# Patient Record
Sex: Female | Born: 1995 | Race: Black or African American | Hispanic: No | Marital: Single | Smoking: Former smoker
Health system: Southern US, Community
[De-identification: ages and names within clinical notes are randomized; demographics above are authoritative.]

## PROBLEM LIST (undated history)

## (undated) ENCOUNTER — Inpatient Hospital Stay (HOSPITAL_COMMUNITY): Payer: Self-pay

## (undated) HISTORY — PX: DILATION AND EVACUATION: SHX1459

---
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
## 2015-02-18 ENCOUNTER — Emergency Department (HOSPITAL_COMMUNITY)
Admission: EM | Admit: 2015-02-18 | Discharge: 2015-02-18 | Disposition: A | Discharge status: Home / Self Care | Payer: Managed Care, Other (non HMO) | Attending: Emergency Medicine | Admitting: Emergency Medicine

## 2015-02-18 ENCOUNTER — Encounter (HOSPITAL_COMMUNITY): Payer: Self-pay | Admitting: Emergency Medicine

## 2015-02-18 DIAGNOSIS — Z793 Long term (current) use of hormonal contraceptives: Secondary | ICD-10-CM | POA: Diagnosis not present

## 2015-02-18 DIAGNOSIS — N390 Urinary tract infection, site not specified: Secondary | ICD-10-CM

## 2015-02-18 DIAGNOSIS — Z3202 Encounter for pregnancy test, result negative: Secondary | ICD-10-CM | POA: Diagnosis not present

## 2015-02-18 DIAGNOSIS — R1031 Right lower quadrant pain: Secondary | ICD-10-CM | POA: Diagnosis present

## 2015-02-18 LAB — BASIC METABOLIC PANEL
Anion gap: 13 (ref 5–15)
BUN: 11 mg/dL (ref 6–20)
CHLORIDE: 104 mmol/L (ref 101–111)
CO2: 24 mmol/L (ref 22–32)
Calcium: 10.1 mg/dL (ref 8.9–10.3)
Creatinine, Ser: 0.95 mg/dL (ref 0.44–1.00)
GFR calc non Af Amer: >60 mL/min (ref >60)
Glucose, Bld: 83 mg/dL (ref 65–99)
POTASSIUM: 4 mmol/L (ref 3.5–5.1)
SODIUM: 141 mmol/L (ref 135–145)

## 2015-02-18 LAB — CBC WITH DIFFERENTIAL/PLATELET
Basophils Absolute: 0 10*3/uL (ref 0.0–0.1)
Basophils Relative: 0 % (ref 0–1)
EOS ABS: 0 10*3/uL (ref 0.0–0.7)
Eosinophils Relative: 0 % (ref 0–5)
HCT: 43.3 % (ref 36.0–46.0)
HEMOGLOBIN: 14.3 g/dL (ref 12.0–15.0)
Lymphocytes Relative: 15 % (ref 12–46)
Lymphs Abs: 1.4 10*3/uL (ref 0.7–4.0)
MCH: 30.2 pg (ref 26.0–34.0)
MCHC: 33 g/dL (ref 30.0–36.0)
MCV: 91.4 fL (ref 78.0–100.0)
MONO ABS: 0.3 10*3/uL (ref 0.1–1.0)
Monocytes Relative: 3 % (ref 3–12)
NEUTROS ABS: 8 10*3/uL — AB (ref 1.7–7.7)
NEUTROS PCT: 82 % — AB (ref 43–77)
Platelets: 247 10*3/uL (ref 150–400)
RBC: 4.74 MIL/uL (ref 3.87–5.11)
RDW: 12.3 % (ref 11.5–15.5)
WBC: 9.7 10*3/uL (ref 4.0–10.5)

## 2015-02-18 LAB — URINALYSIS, ROUTINE W REFLEX MICROSCOPIC
BILIRUBIN URINE: NEGATIVE
Glucose, UA: NEGATIVE mg/dL
KETONES UR: 15 mg/dL — AB
NITRITE: NEGATIVE
PROTEIN: 30 mg/dL — AB
Specific Gravity, Urine: 1.015 (ref 1.005–1.030)
UROBILINOGEN UA: 1 mg/dL (ref 0.0–1.0)
pH: 7.5 (ref 5.0–8.0)

## 2015-02-18 LAB — URINE MICROSCOPIC-ADD ON

## 2015-02-18 LAB — POC URINE PREG, ED: PREG TEST UR: NEGATIVE

## 2015-02-18 MED ORDER — HYDROCODONE-ACETAMINOPHEN 5-325 MG PO TABS
2.0000 | ORAL_TABLET | Freq: Once | ORAL | Status: AC
  Administered 2015-02-18: 2 via ORAL
  Filled 2015-02-18: qty 2

## 2015-02-18 MED ORDER — PHENAZOPYRIDINE HCL 200 MG PO TABS: 200.0000 mg | ORAL_TABLET | Freq: Three times a day (TID) | ORAL | Status: DC

## 2015-02-18 MED ORDER — IBUPROFEN 800 MG PO TABS: 800.0000 mg | ORAL_TABLET | Freq: Three times a day (TID) | ORAL | Status: DC

## 2015-02-18 MED ORDER — CEPHALEXIN 500 MG PO CAPS: 500.0000 mg | ORAL_CAPSULE | Freq: Four times a day (QID) | ORAL | Status: DC

## 2015-02-18 NOTE — ED Provider Notes (Signed)
CSN: [id number]     Arrival date & time 02/18/15  1722 History   First MD Initiated Contact with Patient 02/18/15 1737     Chief Complaint  Patient presents with  . Abdominal Pain     (Consider location/radiation/quality/duration/timing/severity/associated sxs/prior Treatment) HPI Comments: Patient presents to the ED with a chief complaint of RLQ pain.  Patient states that she has had dysuria x 1 week.  States that today she started having some pain that radiates to her side and to her back.  She reports some associated hematuria, chills, and nausea.  She has tried taking ibuprofen with no relief.  There are no aggravating or alleviating factors.  She denies vaginal discharge or bleeding.  The history is provided by the patient. No language interpreter was used.    History reviewed. No pertinent past medical history. History reviewed. No pertinent past surgical history. Family History  Problem Relation Age of Onset  . Hypertension Mother   . Hypertension Father    History  Substance Use Topics  . Smoking status: Never Smoker   . Smokeless tobacco: Not on file  . Alcohol Use: No   OB History    No data available     Review of Systems  Constitutional: Negative for fever and chills.  Respiratory: Negative for shortness of breath.   Cardiovascular: Negative for chest pain.  Gastrointestinal: Positive for abdominal pain. Negative for nausea, vomiting, diarrhea and constipation.  Genitourinary: Positive for dysuria and hematuria.  All other systems reviewed and are negative.     Allergies  Review of patient's allergies indicates no known allergies.  Home Medications   Prior to Admission medications   Medication Sig Start Date End Date Taking? Authorizing Provider  LO LOESTRIN FE 1 MG-10 MCG / 10 MCG tablet 1 tablet daily.  01/06/15  Yes Historical Provider, MD   BP 130/80 mmHg  Pulse 106  Temp(Src) 98.2 F (36.8 C) (Oral)  Resp 20  SpO2 100%  LMP 02/04/2015  (Approximate) Physical Exam  Constitutional: She is oriented to person, place, and time. She appears well-developed and well-nourished.  HENT:  Head: Normocephalic and atraumatic.  Eyes: Conjunctivae and EOM are normal. Pupils are equal, round, and reactive to light.  Neck: Normal range of motion. Neck supple.  Cardiovascular: Normal rate and regular rhythm.  Exam reveals no gallop and no friction rub.   No murmur heard. Pulmonary/Chest: Effort normal and breath sounds normal. No respiratory distress. She has no wheezes. She has no rales. She exhibits no tenderness.  Abdominal: Soft. Bowel sounds are normal. She exhibits no distension and no mass. There is tenderness. There is no rebound and no guarding.  Right lower quadrant is ttp, no rebound or guarding, no other focal abdominal tenderness  Musculoskeletal: Normal range of motion. She exhibits no edema or tenderness.  Neurological: She is alert and oriented to person, place, and time.  Skin: Skin is warm and dry.  Psychiatric: She has a normal mood and affect. Her behavior is normal. Judgment and thought content normal.  Nursing note and vitals reviewed.   ED Course  Procedures (including critical care time) Results for orders placed or performed during the hospital encounter of 02/18/15  Urinalysis, Routine w reflex microscopic  Result Value Ref Range   Color, Urine YELLOW YELLOW   APPearance CLOUDY (A) CLEAR   Specific Gravity, Urine 1.015 1.005 - 1.030   pH 7.5 5.0 - 8.0   Glucose, UA NEGATIVE NEGATIVE mg/dL   Hgb urine dipstick  SMALL (A) NEGATIVE   Bilirubin Urine NEGATIVE NEGATIVE   Ketones, ur 15 (A) NEGATIVE mg/dL   Protein, ur 30 (A) NEGATIVE mg/dL   Urobilinogen, UA 1.0 0.0 - 1.0 mg/dL   Nitrite NEGATIVE NEGATIVE   Leukocytes, UA SMALL (A) NEGATIVE  CBC with Differential/Platelet  Result Value Ref Range   WBC 9.7 4.0 - 10.5 K/uL   RBC 4.74 3.87 - 5.11 MIL/uL   Hemoglobin 14.3 12.0 - 15.0 g/dL   HCT 16.1 09.6 -  04.5 %   MCV 91.4 78.0 - 100.0 fL   MCH 30.2 26.0 - 34.0 pg   MCHC 33.0 30.0 - 36.0 g/dL   RDW 40.9 81.1 - 91.4 %   Platelets 247 150 - 400 K/uL   Neutrophils Relative % 82 (H) 43 - 77 %   Neutro Abs 8.0 (H) 1.7 - 7.7 K/uL   Lymphocytes Relative 15 12 - 46 %   Lymphs Abs 1.4 0.7 - 4.0 K/uL   Monocytes Relative 3 3 - 12 %   Monocytes Absolute 0.3 0.1 - 1.0 K/uL   Eosinophils Relative 0 0 - 5 %   Eosinophils Absolute 0.0 0.0 - 0.7 K/uL   Basophils Relative 0 0 - 1 %   Basophils Absolute 0.0 0.0 - 0.1 K/uL  Basic metabolic panel  Result Value Ref Range   Sodium 141 135 - 145 mmol/L   Potassium 4.0 3.5 - 5.1 mmol/L   Chloride 104 101 - 111 mmol/L   CO2 24 22 - 32 mmol/L   Glucose, Bld 83 65 - 99 mg/dL   BUN 11 6 - 20 mg/dL   Creatinine, Ser 7.82 0.44 - 1.00 mg/dL   Calcium 95.6 8.9 - 21.3 mg/dL   GFR calc non Af Amer >60 >60 mL/min   GFR calc Af Amer >60 >60 mL/min   Anion gap 13 5 - 15  Urine microscopic-add on  Result Value Ref Range   Squamous Epithelial / LPF RARE RARE   WBC, UA 21-50 <3 WBC/hpf   RBC / HPF 7-10 <3 RBC/hpf   Bacteria, UA FEW (A) RARE   Urine-Other MUCOUS PRESENT   POC urine preg, ED (not at [facility])  Result Value Ref Range   Preg Test, Ur NEGATIVE NEGATIVE   No results found.    EKG Interpretation None      MDM   Final diagnoses:  UTI (lower urinary tract infection)    Patient with RLQ abdominal pain and dysuria.  Suspect that as the patient has been having dysuria x 1 week that this is UTI vs early pyelo, but other etiologies for RLQ pain remain on the DDx.  Will check labs and UA and reassess.  Patient is otherwise non-toxic appearing and not in any apparent distress.  Labs are consistent with urinary tract infection. I believe this to be the most likely cause of the patient's symptoms. However, I discussed with the patient that I cannot rule out appendicitis or pain of ovarian etiology without advanced imaging. At this time, I do not feel this  is warranted, but have given the patient strict return precautions to which she agrees with.  Patient is stable and ready for discharge.    Roxy Horseman, PA-C 02/18/15 1909  Mancel Bale, MD 02/18/15 [phone]

## 2015-02-18 NOTE — Discharge Instructions (Signed)
Your emergency department workup today is complete. Findings are consistent with urinary tract infection. Should you have worsening symptoms, worsening pain, fever, or vomiting please return to the emergency department for repeat evaluation. Otherwise, please take your anti-biotics as prescribed.  Abdominal Pain Many things can cause abdominal pain. Usually, abdominal pain is not caused by a disease and will improve without treatment. It can often be observed and treated at home. Your health care provider will do a physical exam and possibly order blood tests and X-rays to help determine the seriousness of your pain. However, in many cases, more time must pass before a clear cause of the pain can be found. Before that point, your health care provider may not know if you need more testing or further treatment. HOME CARE INSTRUCTIONS  Monitor your abdominal pain for any changes. The following actions may help to alleviate any discomfort you are experiencing:  Only take over-the-counter or prescription medicines as directed by your health care provider.  Do not take laxatives unless directed to do so by your health care provider.  Try a clear liquid diet (broth, tea, or water) as directed by your health care provider. Slowly move to a bland diet as tolerated. SEEK MEDICAL CARE IF:  You have unexplained abdominal pain.  You have abdominal pain associated with nausea or diarrhea.  You have pain when you urinate or have a bowel movement.  You experience abdominal pain that wakes you in the night.  You have abdominal pain that is worsened or improved by eating food.  You have abdominal pain that is worsened with eating fatty foods.  You have a fever. SEEK IMMEDIATE MEDICAL CARE IF:   Your pain does not go away within 2 hours.  You keep throwing up (vomiting).  Your pain is felt only in portions of the abdomen, such as the right side or the left lower portion of the abdomen.  You pass  bloody or black tarry stools. MAKE SURE YOU:  Understand these instructions.   Will watch your condition.   Will get help right away if you are not doing well or get worse.  Document Released: 06/22/2005 Document Revised: 09/17/2013 Document Reviewed: 05/22/2013 [facility] Patient Information 2015 [place], [place]. This information is not intended to replace advice given to you by your health care provider. Make sure you discuss any questions you have with your health care provider. Urinary Tract Infection Urinary tract infections (UTIs) can develop anywhere along your urinary tract. Your urinary tract is your body's drainage system for removing wastes and extra water. Your urinary tract includes two kidneys, two ureters, a bladder, and a urethra. Your kidneys are a pair of bean-shaped organs. Each kidney is about the size of your fist. They are located below your ribs, one on each side of your spine. CAUSES Infections are caused by microbes, which are microscopic organisms, including fungi, viruses, and bacteria. These organisms are so small that they can only be seen through a microscope. Bacteria are the microbes that most commonly cause UTIs. SYMPTOMS  Symptoms of UTIs may vary by age and gender of the patient and by the location of the infection. Symptoms in young women typically include a frequent and intense urge to urinate and a painful, burning feeling in the bladder or urethra during urination. Older women and men are more likely to be tired, shaky, and weak and have muscle aches and abdominal pain. A fever may mean the infection is in your kidneys. Other symptoms of a  kidney infection include pain in your back or sides below the ribs, nausea, and vomiting. DIAGNOSIS To diagnose a UTI, your caregiver will ask you about your symptoms. Your caregiver also will ask to provide a urine sample. The urine sample will be tested for bacteria and white blood cells. White blood cells are made by your  body to help fight infection. TREATMENT  Typically, UTIs can be treated with medication. Because most UTIs are caused by a bacterial infection, they usually can be treated with the use of antibiotics. The choice of antibiotic and length of treatment depend on your symptoms and the type of bacteria causing your infection. HOME CARE INSTRUCTIONS  If you were prescribed antibiotics, take them exactly as your caregiver instructs you. Finish the medication even if you feel better after you have only taken some of the medication.  Drink enough water and fluids to keep your urine clear or pale yellow.  Avoid caffeine, tea, and carbonated beverages. They tend to irritate your bladder.  Empty your bladder often. Avoid holding urine for long periods of time.  Empty your bladder before and after sexual intercourse.  After a bowel movement, women should cleanse from front to back. Use each tissue only once. SEEK MEDICAL CARE IF:   You have back pain.  You develop a fever.  Your symptoms do not begin to resolve within 3 days. SEEK IMMEDIATE MEDICAL CARE IF:   You have severe back pain or lower abdominal pain.  You develop chills.  You have nausea or vomiting.  You have continued burning or discomfort with urination. MAKE SURE YOU:   Understand these instructions.  Will watch your condition.  Will get help right away if you are not doing well or get worse. Document Released: 06/22/2005 Document Revised: 03/13/2012 Document Reviewed: 10/21/2011 [facility] Patient Information 2015 [place]. This information is not intended to replace advice given to you by your health care provider. Make sure you discuss any questions you have with your health care provider.

## 2015-02-18 NOTE — ED Notes (Signed)
Pt reports that she began having pain when she urinates for the last week.  This morning when she woke up she had pain in her right lower abdomen which sometimes "shoot" to her back. She has also reports chills but no fever, general tiredness. Mom is bedside.

## 2015-02-18 NOTE — ED Notes (Signed)
Nurse currently drawing labs 

## 2015-02-21 ENCOUNTER — Encounter (HOSPITAL_COMMUNITY): Payer: Self-pay | Admitting: Emergency Medicine

## 2015-02-21 ENCOUNTER — Emergency Department (HOSPITAL_COMMUNITY): Payer: Managed Care, Other (non HMO)

## 2015-02-21 ENCOUNTER — Emergency Department (HOSPITAL_COMMUNITY)
Admission: EM | Admit: 2015-02-21 | Discharge: 2015-02-21 | Disposition: A | Discharge status: Home / Self Care | Payer: Managed Care, Other (non HMO) | Attending: Emergency Medicine | Admitting: Emergency Medicine

## 2015-02-21 DIAGNOSIS — R000 Tachycardia, unspecified: Secondary | ICD-10-CM | POA: Insufficient documentation

## 2015-02-21 DIAGNOSIS — N390 Urinary tract infection, site not specified: Secondary | ICD-10-CM | POA: Diagnosis present

## 2015-02-21 DIAGNOSIS — Z3202 Encounter for pregnancy test, result negative: Secondary | ICD-10-CM | POA: Insufficient documentation

## 2015-02-21 DIAGNOSIS — Z79899 Other long term (current) drug therapy: Secondary | ICD-10-CM | POA: Insufficient documentation

## 2015-02-21 DIAGNOSIS — N12 Tubulo-interstitial nephritis, not specified as acute or chronic: Secondary | ICD-10-CM | POA: Insufficient documentation

## 2015-02-21 LAB — URINE MICROSCOPIC-ADD ON

## 2015-02-21 LAB — URINALYSIS, ROUTINE W REFLEX MICROSCOPIC
Glucose, UA: NEGATIVE mg/dL
KETONES UR: NEGATIVE mg/dL
NITRITE: POSITIVE — AB
PROTEIN: 100 mg/dL — AB
SPECIFIC GRAVITY, URINE: 1.018 (ref 1.005–1.030)
UROBILINOGEN UA: 1 mg/dL (ref 0.0–1.0)
pH: 5.5 (ref 5.0–8.0)

## 2015-02-21 LAB — CBC WITH DIFFERENTIAL/PLATELET
BASOS ABS: 0 10*3/uL (ref 0.0–0.1)
BASOS PCT: 0 % (ref 0–1)
EOS ABS: 0.1 10*3/uL (ref 0.0–0.7)
EOS PCT: 1 % (ref 0–5)
HCT: 38.6 % (ref 36.0–46.0)
Hemoglobin: 12.7 g/dL (ref 12.0–15.0)
LYMPHS ABS: 1.6 10*3/uL (ref 0.7–4.0)
Lymphocytes Relative: 18 % (ref 12–46)
MCH: 30.1 pg (ref 26.0–34.0)
MCHC: 32.9 g/dL (ref 30.0–36.0)
MCV: 91.5 fL (ref 78.0–100.0)
Monocytes Absolute: 0.8 10*3/uL (ref 0.1–1.0)
Monocytes Relative: 9 % (ref 3–12)
NEUTROS ABS: 6.7 10*3/uL (ref 1.7–7.7)
NEUTROS PCT: 72 % (ref 43–77)
PLATELETS: 257 10*3/uL (ref 150–400)
RBC: 4.22 MIL/uL (ref 3.87–5.11)
RDW: 12.4 % (ref 11.5–15.5)
WBC: 9.1 10*3/uL (ref 4.0–10.5)

## 2015-02-21 LAB — COMPREHENSIVE METABOLIC PANEL
ALK PHOS: 63 U/L (ref 38–126)
ALT: 11 U/L — ABNORMAL LOW (ref 14–54)
AST: 15 U/L (ref 15–41)
Albumin: 4.3 g/dL (ref 3.5–5.0)
Anion gap: 10 (ref 5–15)
BILIRUBIN TOTAL: 0.6 mg/dL (ref 0.3–1.2)
BUN: 11 mg/dL (ref 6–20)
CO2: 24 mmol/L (ref 22–32)
CREATININE: 1.03 mg/dL — AB (ref 0.44–1.00)
Calcium: 9.3 mg/dL (ref 8.9–10.3)
Chloride: 104 mmol/L (ref 101–111)
GFR calc Af Amer: >60 mL/min (ref >60)
GFR calc non Af Amer: >60 mL/min (ref >60)
GLUCOSE: 101 mg/dL — AB (ref 65–99)
POTASSIUM: 3.7 mmol/L (ref 3.5–5.1)
SODIUM: 138 mmol/L (ref 135–145)
TOTAL PROTEIN: 8.3 g/dL — AB (ref 6.5–8.1)

## 2015-02-21 LAB — POC URINE PREG, ED: Preg Test, Ur: NEGATIVE

## 2015-02-21 LAB — I-STAT CG4 LACTIC ACID, ED: Lactic Acid, Venous: 2.06 mmol/L (ref 0.5–2.0)

## 2015-02-21 LAB — LIPASE, BLOOD: Lipase: 13 U/L — ABNORMAL LOW (ref 22–51)

## 2015-02-21 MED ORDER — CEFTRIAXONE SODIUM 1 G IJ SOLR
1.0000 g | Freq: Once | INTRAMUSCULAR | Status: AC
  Administered 2015-02-21: 1 g via INTRAVENOUS
  Filled 2015-02-21: qty 10

## 2015-02-21 MED ORDER — SODIUM CHLORIDE 0.9 % IV BOLUS (SEPSIS)
1000.0000 mL | Freq: Once | INTRAVENOUS | Status: AC
  Administered 2015-02-21: 1000 mL via INTRAVENOUS

## 2015-02-21 MED ORDER — HYDROCODONE-ACETAMINOPHEN 5-325 MG PO TABS: 1.0000 | ORAL_TABLET | Freq: Four times a day (QID) | ORAL | Status: DC | PRN

## 2015-02-21 MED ORDER — ACETAMINOPHEN 325 MG PO TABS
650.0000 mg | ORAL_TABLET | Freq: Once | ORAL | Status: AC
  Administered 2015-02-21: 650 mg via ORAL
  Filled 2015-02-21: qty 2

## 2015-02-21 MED ORDER — ONDANSETRON HCL 4 MG/2ML IJ SOLN
4.0000 mg | Freq: Once | INTRAMUSCULAR | Status: AC
  Administered 2015-02-21: 4 mg via INTRAVENOUS
  Filled 2015-02-21: qty 2

## 2015-02-21 MED ORDER — CIPROFLOXACIN HCL 500 MG PO TABS: 500.0000 mg | ORAL_TABLET | Freq: Two times a day (BID) | ORAL | Status: DC

## 2015-02-21 MED ORDER — MORPHINE SULFATE 4 MG/ML IJ SOLN
4.0000 mg | Freq: Once | INTRAMUSCULAR | Status: AC
  Administered 2015-02-21: 4 mg via INTRAVENOUS
  Filled 2015-02-21: qty 1

## 2015-02-21 NOTE — ED Notes (Signed)
US at bedside; will administer medications with procedure completion.

## 2015-02-21 NOTE — Discharge Instructions (Signed)
Pyelonephritis, Adult °Pyelonephritis is a kidney infection. In general, there are 2 main types of pyelonephritis: °· Infections that come on quickly without any warning (acute pyelonephritis). °· Infections that persist for a long period of time (chronic pyelonephritis). °CAUSES  °Two main causes of pyelonephritis are: °· Bacteria traveling from the bladder to the kidney. This is a problem especially in pregnant women. The urine in the bladder can become filled with bacteria from multiple causes, including: °¨ Inflammation of the prostate gland (prostatitis). °¨ Sexual intercourse in females. °¨ Bladder infection (cystitis). °· Bacteria traveling from the bloodstream to the tissue part of the kidney. °Problems that may increase your risk of getting a kidney infection include: °· Diabetes. °· Kidney stones or bladder stones. °· Cancer. °· Catheters placed in the bladder. °· Other abnormalities of the kidney or ureter. °SYMPTOMS  °· Abdominal pain. °· Pain in the side or flank area. °· Fever. °· Chills. °· Upset stomach. °· Blood in the urine (dark urine). °· Frequent urination. °· Strong or persistent urge to urinate. °· Burning or stinging when urinating. °DIAGNOSIS  °Your caregiver may diagnose your kidney infection based on your symptoms. A urine sample may also be taken. °TREATMENT  °In general, treatment depends on how severe the infection is.  °· If the infection is mild and caught early, your caregiver may treat you with oral antibiotics and send you home. °· If the infection is more severe, the bacteria may have gotten into the bloodstream. This will require intravenous (IV) antibiotics and a hospital stay. Symptoms may include: °¨ High fever. °¨ Severe flank pain. °¨ Shaking chills. °· Even after a hospital stay, your caregiver may require you to be on oral antibiotics for a period of time. °· Other treatments may be required depending upon the cause of the infection. °HOME CARE INSTRUCTIONS  °· Take your  antibiotics as directed. Finish them even if you start to feel better. °· Make an appointment to have your urine checked to make sure the infection is gone. °· Drink enough fluids to keep your urine clear or pale yellow. °· Take medicines for the bladder if you have urgency and frequency of urination as directed by your caregiver. °SEEK IMMEDIATE MEDICAL CARE IF:  °· You have a fever or persistent symptoms for more than 2-3 days. °· You have a fever and your symptoms suddenly get worse. °· You are unable to take your antibiotics or fluids. °· You develop shaking chills. °· You experience extreme weakness or fainting. °· There is no improvement after 2 days of treatment. °MAKE SURE YOU: °· Understand these instructions. °· Will watch your condition. °· Will get help right away if you are not doing well or get worse. °Document Released: 09/12/2005 Document Revised: 03/13/2012 Document Reviewed: 02/16/2011 °[employer]® Patient Information ©2015 [employer]. This information is not intended to replace advice given to you by your health care provider. Make sure you discuss any questions you have with your health care provider. ° °

## 2015-02-21 NOTE — ED Notes (Addendum)
Pt seen here on 5/25 dx with UTI, and told to return if pain increased. Pt states pain has increased to L flank while taking 800mg  Ibuprofen at home. Pt has also been taking Keflex and Pyridium, without symptom relief. Pt now c/o nausea, as well.

## 2015-02-21 NOTE — ED Provider Notes (Signed)
CSN: [id number]     Arrival date & time 02/21/15  0845 History   First MD Initiated Contact with Patient 02/21/15 [phone]     Chief Complaint  Patient presents with  . Urinary Tract Infection     (Consider location/radiation/quality/duration/timing/severity/associated sxs/prior Treatment) HPI  19 year old female presents with left-sided abdominal pain and flank pain. The patient was diagnosed with a UTI on 5/25 and given Keflex and Pyridium but states that her pain has increased. She's been having burning with urination for the last 2 weeks although that is now improving while on about antibiotics. Now is having nausea and vomited once a couple days ago. Denies diarrhea. Denies lower abdominal pain but more left flank pain and back pain. No cough or shortness of breath. Has not noticed any fevers. Patient has been taking ibuprofen at home without significant relief. Patient denies any vaginal bleeding or discharge. No right-sided abdominal pain.  History reviewed. No pertinent past medical history. History reviewed. No pertinent past surgical history. Family History  Problem Relation Age of Onset  . Hypertension Mother   . Hypertension Father    History  Substance Use Topics  . Smoking status: Never Smoker   . Smokeless tobacco: Not on file  . Alcohol Use: No   OB History    No data available     Review of Systems  Constitutional: Negative for fever.  Respiratory: Negative for cough and shortness of breath.   Gastrointestinal: Positive for nausea and abdominal pain. Negative for diarrhea.  Genitourinary: Positive for dysuria and flank pain. Negative for hematuria, vaginal bleeding and vaginal discharge.  Musculoskeletal: Positive for back pain.  All other systems reviewed and are negative.     Allergies  Review of patient's allergies indicates no known allergies.  Home Medications   Prior to Admission medications   Medication Sig Start Date End Date Taking? Authorizing  Provider  cephALEXin (KEFLEX) 500 MG capsule Take 1 capsule (500 mg total) by mouth 4 (four) times daily. 02/18/15   Roxy Horseman, PA-C  ibuprofen (ADVIL,MOTRIN) 800 MG tablet Take 1 tablet (800 mg total) by mouth 3 (three) times daily. 02/18/15   Roxy Horseman, PA-C  LO LOESTRIN FE 1 MG-10 MCG / 10 MCG tablet 1 tablet daily.  01/06/15   Historical Provider, MD  phenazopyridine (PYRIDIUM) 200 MG tablet Take 1 tablet (200 mg total) by mouth 3 (three) times daily. 02/18/15   Roxy Horseman, PA-C   BP 129/74 mmHg  Pulse 111  Temp(Src) 99.4 F (37.4 C) (Oral)  Resp 18  Ht  (1.6 m)  Wt 117 lb (53.071 kg)  BMI 20.73 kg/m2  SpO2 96%  LMP 02/04/2015 (Approximate) Physical Exam  Constitutional: She is oriented to person, place, and time. She appears well-developed and well-nourished.  HENT:  Head: Normocephalic and atraumatic.  Right Ear: External ear normal.  Left Ear: External ear normal.  Nose: Nose normal.  Eyes: Right eye exhibits no discharge. Left eye exhibits no discharge.  Cardiovascular: Regular rhythm and normal heart sounds.  Tachycardia present.   Pulmonary/Chest: Effort normal and breath sounds normal. She has no wheezes. She has no rales.  Abdominal: Soft. Normal appearance. There is tenderness (mild) in the right upper quadrant, epigastric area and left upper quadrant. There is CVA tenderness (L>R). There is no rigidity and no guarding.  No lower abdominal tenderness  Neurological: She is alert and oriented to person, place, and time.  Skin: Skin is warm and dry.  Nursing note and vitals reviewed.  ED Course  Procedures (including critical care time) Labs Review Labs Reviewed  COMPREHENSIVE METABOLIC PANEL - Abnormal; Notable for the following:    Glucose, Bld 101 (*)    Creatinine, Ser 1.03 (*)    Total Protein 8.3 (*)    ALT 11 (*)    All other components within normal limits  LIPASE, BLOOD - Abnormal; Notable for the following:    Lipase 13 (*)    All  other components within normal limits  URINALYSIS, ROUTINE W REFLEX MICROSCOPIC ([facility]) - Abnormal; Notable for the following:    Color, Urine ORANGE (*)    APPearance CLOUDY (*)    Hgb urine dipstick MODERATE (*)    Bilirubin Urine SMALL (*)    Protein, ur 100 (*)    Nitrite POSITIVE (*)    Leukocytes, UA SMALL (*)    All other components within normal limits  URINE MICROSCOPIC-ADD ON - Abnormal; Notable for the following:    Squamous Epithelial / LPF MANY (*)    Bacteria, UA MANY (*)    All other components within normal limits  I-STAT CG4 LACTIC ACID, ED - Abnormal; Notable for the following:    Lactic Acid, Venous 2.06 (*)    All other components within normal limits  URINE CULTURE  CBC WITH DIFFERENTIAL/PLATELET  POC URINE PREG, ED    Imaging Review [place] Abdomen Complete  02/21/2015   CLINICAL DATA:  Abdominal and left flank pain x3 days  EXAM: COMPLETE ABDOMINAL ULTRASOUND  COMPARISON:  None available  FINDINGS: Gallbladder: Physiologically distended without stones, wall thickening, or pericholecystic fluid. Sonographer reports no sonographic Murphy's sign.  Common bile duct:  Normal in caliber,  2-574mm diameter.  Liver: Homogeneous in echotexture without focal lesion or intrahepatic bile duct dilatation.  IVC:  Negative  Pancreas:  Negative  Spleen:  No focal lesion, craniocaudal 8.1cm in length.  Right Kidney:  No mass or hydronephrosis, 12.8cm in length.  Left Kidney: No lesion or hydronephrosis, 9.9 acute acutecm in length. Small echogenic focus in the lower pole collecting system.  Abdominal aorta:  Negative  IMPRESSION: 1. Normal gallbladder. 2. Possible left nephrolithiasis, without hydronephrosis.   Electronically Signed   By: Corlis Leak  Hassell M.D.   On: 02/21/2015 10:35     EKG Interpretation None      MDM   Final diagnoses:  Pyelonephritis    Patient's symptoms appear consistent with pyelonephritis. She has a low-grade fever with mild tachycardia. Pain has improved  and she appears well. She does have a mild lactic acid of 2, but has normal white blood cell count and normal renal function. Urinalysis is a dirty catch but is consistent with UTI. Ultrasound of her abdomen shows no obvious obstructing kidney stone or hydronephrosis. She has no lower abdominal symptoms which is a little odd but I believe she will need further treatment for pyelonephritis. I will change her and a box from Keflex to Cipro and she was given a dose of IV Rocephin here. She has a PCP, I have recommended they follow-up in 2 days or otherwise return here if symptoms are worsening.    Pricilla LovelessScott Aadhya Bustamante, MD 02/21/15 1055

## 2015-02-22 ENCOUNTER — Encounter (HOSPITAL_COMMUNITY): Payer: Self-pay

## 2015-02-22 ENCOUNTER — Emergency Department (HOSPITAL_COMMUNITY): Payer: Managed Care, Other (non HMO)

## 2015-02-22 ENCOUNTER — Emergency Department (HOSPITAL_COMMUNITY)
Admission: EM | Admit: 2015-02-22 | Discharge: 2015-02-22 | Disposition: A | Discharge status: Home / Self Care | Payer: Managed Care, Other (non HMO) | Attending: Emergency Medicine | Admitting: Emergency Medicine

## 2015-02-22 DIAGNOSIS — R110 Nausea: Secondary | ICD-10-CM | POA: Insufficient documentation

## 2015-02-22 DIAGNOSIS — Z79899 Other long term (current) drug therapy: Secondary | ICD-10-CM | POA: Insufficient documentation

## 2015-02-22 DIAGNOSIS — R300 Dysuria: Secondary | ICD-10-CM | POA: Insufficient documentation

## 2015-02-22 DIAGNOSIS — R109 Unspecified abdominal pain: Secondary | ICD-10-CM | POA: Diagnosis present

## 2015-02-22 DIAGNOSIS — I10 Essential (primary) hypertension: Secondary | ICD-10-CM | POA: Insufficient documentation

## 2015-02-22 DIAGNOSIS — IMO0001 Reserved for inherently not codable concepts without codable children: Secondary | ICD-10-CM

## 2015-02-22 LAB — CBC WITH DIFFERENTIAL/PLATELET
Basophils Absolute: 0 10*3/uL (ref 0.0–0.1)
Basophils Relative: 0 % (ref 0–1)
EOS ABS: 0.1 10*3/uL (ref 0.0–0.7)
Eosinophils Relative: 2 % (ref 0–5)
HCT: 35.3 % — ABNORMAL LOW (ref 36.0–46.0)
Hemoglobin: 11.4 g/dL — ABNORMAL LOW (ref 12.0–15.0)
Lymphocytes Relative: 33 % (ref 12–46)
Lymphs Abs: 2.1 10*3/uL (ref 0.7–4.0)
MCH: 29.7 pg (ref 26.0–34.0)
MCHC: 32.3 g/dL (ref 30.0–36.0)
MCV: 91.9 fL (ref 78.0–100.0)
MONO ABS: 0.4 10*3/uL (ref 0.1–1.0)
Monocytes Relative: 7 % (ref 3–12)
Neutro Abs: 3.6 10*3/uL (ref 1.7–7.7)
Neutrophils Relative %: 58 % (ref 43–77)
PLATELETS: 246 10*3/uL (ref 150–400)
RBC: 3.84 MIL/uL — AB (ref 3.87–5.11)
RDW: 12.2 % (ref 11.5–15.5)
WBC: 6.2 10*3/uL (ref 4.0–10.5)

## 2015-02-22 LAB — COMPREHENSIVE METABOLIC PANEL
ALK PHOS: 48 U/L (ref 38–126)
ALT: 8 U/L — ABNORMAL LOW (ref 14–54)
ANION GAP: 7 (ref 5–15)
AST: 10 U/L — ABNORMAL LOW (ref 15–41)
Albumin: 3.4 g/dL — ABNORMAL LOW (ref 3.5–5.0)
BUN: 8 mg/dL (ref 6–20)
CHLORIDE: 105 mmol/L (ref 101–111)
CO2: 27 mmol/L (ref 22–32)
Calcium: 9 mg/dL (ref 8.9–10.3)
Creatinine, Ser: 0.83 mg/dL (ref 0.44–1.00)
GLUCOSE: 89 mg/dL (ref 65–99)
POTASSIUM: 3.5 mmol/L (ref 3.5–5.1)
SODIUM: 139 mmol/L (ref 135–145)
TOTAL PROTEIN: 6.8 g/dL (ref 6.5–8.1)
Total Bilirubin: 0.4 mg/dL (ref 0.3–1.2)

## 2015-02-22 LAB — URINE MICROSCOPIC-ADD ON

## 2015-02-22 LAB — URINALYSIS, ROUTINE W REFLEX MICROSCOPIC
Bilirubin Urine: NEGATIVE
Glucose, UA: NEGATIVE mg/dL
Ketones, ur: NEGATIVE mg/dL
NITRITE: NEGATIVE
PROTEIN: 30 mg/dL — AB
Specific Gravity, Urine: 1.014 (ref 1.005–1.030)
UROBILINOGEN UA: 1 mg/dL (ref 0.0–1.0)
pH: 6 (ref 5.0–8.0)

## 2015-02-22 LAB — URINE CULTURE
COLONY COUNT: NO GROWTH
CULTURE: NO GROWTH

## 2015-02-22 LAB — LIPASE, BLOOD: Lipase: 13 U/L — ABNORMAL LOW (ref 22–51)

## 2015-02-22 MED ORDER — DEXTROSE 5 % IV SOLN
1.0000 g | Freq: Once | INTRAVENOUS | Status: AC
  Administered 2015-02-22: 1 g via INTRAVENOUS
  Filled 2015-02-22: qty 10

## 2015-02-22 MED ORDER — KETOROLAC TROMETHAMINE 30 MG/ML IJ SOLN
30.0000 mg | Freq: Once | INTRAMUSCULAR | Status: AC
  Administered 2015-02-22: 30 mg via INTRAVENOUS
  Filled 2015-02-22: qty 1

## 2015-02-22 MED ORDER — SODIUM CHLORIDE 0.9 % IV BOLUS (SEPSIS)
1000.0000 mL | Freq: Once | INTRAVENOUS | Status: AC
  Administered 2015-02-22: 1000 mL via INTRAVENOUS

## 2015-02-22 MED ORDER — ONDANSETRON HCL 4 MG/2ML IJ SOLN
4.0000 mg | Freq: Once | INTRAMUSCULAR | Status: AC
  Administered 2015-02-22: 4 mg via INTRAVENOUS
  Filled 2015-02-22: qty 2

## 2015-02-22 NOTE — ED Provider Notes (Signed)
Patient signed out to me by Jaynie Crumbleatyana Kirichenko PA-C at shift change pending urinalysis results. Please see her note for full H&P. Patient here for flank pain, suspected nephritis, has antibiotics, pain medication, antinausea medication at home. Urinalysis unchanged from yesterday with the addition of a few more red blood cells. Repeat abdominal exam showed minor tenderness to the left quadrant minimal CVA tenderness. Patient was nontoxic appearing, in no acute distress, reports improved symptoms. She discharged home with instructions to use prescribed medications as directed, follow-up in 2-3 days with primary care provider for further evaluation and management of symptoms continue to persist. She is instructed to return to emergency room immediately if new or worsening signs or symptoms present. Patient verbalized understanding and agreement to the plan and had no further questions or concerns at time of discharge.  Eyvonne MechanicJeffrey Mikki Ziff, PA-C 02/22/15 1742  Doug SouSam Jacubowitz, MD 02/23/15 0001

## 2015-02-22 NOTE — ED Notes (Signed)
Patient c/o left flank pain and states has had x 3 weeks. Patient was seen in the ED for the same on 02/18/15 and 02/21/15 for a kidney infection and kidney stone. Patient states the pain has gotten progressively worse. Patient also c/o nausea.

## 2015-02-22 NOTE — ED Notes (Signed)
Pt. Asked to give urine specimen, can't void at present moment. Nurse Aware

## 2015-02-22 NOTE — ED Notes (Signed)
Patient was educated not to drive, operate heavy machinery, or drink alcohol while taking narcotic medication.  

## 2015-02-22 NOTE — Discharge Instructions (Signed)
Continue cipro until all gone. Take ibuprofen for pain in addition to Norco for pain.  It is not unusual to have pain for several days with kidney infection.  You can take Noorco 1-2 tabs every 4-6 hrs as needed. If symptoms not improving return to ER or follow up. Today CT and labs look good.   Pyelonephritis, Adult Pyelonephritis is a kidney infection. In general, there are 2 main types of pyelonephritis:  Infections that come on quickly without any warning (acute pyelonephritis).  Infections that persist for a long period of time (chronic pyelonephritis). CAUSES  Two main causes of pyelonephritis are:  Bacteria traveling from the bladder to the kidney. This is a problem especially in pregnant women. The urine in the bladder can become filled with bacteria from multiple causes, including:  Inflammation of the prostate gland (prostatitis).  Sexual intercourse in females.  Bladder infection (cystitis).  Bacteria traveling from the bloodstream to the tissue part of the kidney. Problems that may increase your risk of getting a kidney infection include:  Diabetes.  Kidney stones or bladder stones.  Cancer.  Catheters placed in the bladder.  Other abnormalities of the kidney or ureter. SYMPTOMS   Abdominal pain.  Pain in the side or flank area.  Fever.  Chills.  Upset stomach.  Blood in the urine (dark urine).  Frequent urination.  Strong or persistent urge to urinate.  Burning or stinging when urinating. DIAGNOSIS  Your caregiver may diagnose your kidney infection based on your symptoms. A urine sample may also be taken. TREATMENT  In general, treatment depends on how severe the infection is.   If the infection is mild and caught early, your caregiver may treat you with oral antibiotics and send you home.  If the infection is more severe, the bacteria may have gotten into the bloodstream. This will require intravenous (IV) antibiotics and a hospital stay.  Symptoms may include:  High fever.  Severe flank pain.  Shaking chills.  Even after a hospital stay, your caregiver may require you to be on oral antibiotics for a period of time.  Other treatments may be required depending upon the cause of the infection. HOME CARE INSTRUCTIONS   Take your antibiotics as directed. Finish them even if you start to feel better.  Make an appointment to have your urine checked to make sure the infection is gone.  Drink enough fluids to keep your urine clear or pale yellow.  Take medicines for the bladder if you have urgency and frequency of urination as directed by your caregiver. SEEK IMMEDIATE MEDICAL CARE IF:   You have a fever or persistent symptoms for more than 2-3 days.  You have a fever and your symptoms suddenly get worse.  You are unable to take your antibiotics or fluids.  You develop shaking chills.  You experience extreme weakness or fainting.  There is no improvement after 2 days of treatment. MAKE SURE YOU:  Understand these instructions.  Will watch your condition.  Will get help right away if you are not doing well or get worse. Document Released: 09/12/2005 Document Revised: 03/13/2012 Document Reviewed: 02/16/2011 [facility] Patient Information 2015 [employer], [place]. This information is not intended to replace advice given to you by your health care provider. Make sure you discuss any questions you have with your health care provider.

## 2015-02-22 NOTE — ED Provider Notes (Signed)
CSN: [id number]     Arrival date & time 02/22/15  1239 History   First MD Initiated Contact with Patient 02/22/15 1304     Chief Complaint  Patient presents with  . Flank Pain  . Nausea     (Consider location/radiation/quality/duration/timing/severity/associated sxs/prior Treatment) HPI Melissa Lattie CornsMorse is a 19 y.o. female with history of hypertension, presents to emergency department complaining of nausea, left flank pain, urinary symptoms. Patient states her symptoms started with dysuria 4 days ago. She was seen in emergency department. At that time she was started on Keflex, Pyridium. She states that her symptoms did not get better and she was seen again yesterday. At that time she developed left flank pain and nausea. She was evaluated with labs, urine analysis which again showed possible UTI, she had ultrasound abdomen which showed possible left renal stone with no hydronephrosis. She was diagnosed up all nephritis, she received 1 g of Rocephin and discharged home now on Cipro. Patient states since yesterday pain got worse. She denies any vomiting. She denies any diarrhea. She still having some dysuria. She denies any fever or chills.  History reviewed. No pertinent past medical history. History reviewed. No pertinent past surgical history. Family History  Problem Relation Age of Onset  . Hypertension Mother   . Hypertension Father    History  Substance Use Topics  . Smoking status: Never Smoker   . Smokeless tobacco: Never Used  . Alcohol Use: No   OB History    No data available     Review of Systems  Constitutional: Negative for fever and chills.  Respiratory: Negative for cough, chest tightness and shortness of breath.   Cardiovascular: Negative for chest pain, palpitations and leg swelling.  Gastrointestinal: Positive for nausea and abdominal pain. Negative for vomiting and diarrhea.  Genitourinary: Positive for dysuria and flank pain. Negative for vaginal bleeding, vaginal  discharge, vaginal pain and pelvic pain.  Musculoskeletal: Negative for myalgias, arthralgias, neck pain and neck stiffness.  Skin: Negative for rash.  Neurological: Negative for dizziness, weakness and headaches.  All other systems reviewed and are negative.     Allergies  Review of patient's allergies indicates no known allergies.  Home Medications   Prior to Admission medications   Medication Sig Start Date End Date Taking? Authorizing Provider  ciprofloxacin (CIPRO) 500 MG tablet Take 1 tablet (500 mg total) by mouth 2 (two) times daily. One po bid x 7 days 02/21/15  Yes Pricilla LovelessScott Goldston, MD  HYDROcodone-acetaminophen (NORCO) 5-325 MG per tablet Take 1 tablet by mouth every 6 (six) hours as needed for severe pain. 02/21/15  Yes Pricilla LovelessScott Goldston, MD  LO LOESTRIN FE 1 MG-10 MCG / 10 MCG tablet 1 tablet daily.  01/06/15  Yes Historical Provider, MD  ibuprofen (ADVIL,MOTRIN) 800 MG tablet Take 1 tablet (800 mg total) by mouth 3 (three) times daily. Patient not taking: Reported on 02/22/2015 02/18/15   Roxy Horsemanobert Browning, PA-C  phenazopyridine (PYRIDIUM) 200 MG tablet Take 1 tablet (200 mg total) by mouth 3 (three) times daily. Patient not taking: Reported on 02/22/2015 02/18/15   Roxy Horsemanobert Browning, PA-C   BP 128/82 mmHg  Pulse 88  Temp(Src) 98.4 F (36.9 C) (Oral)  Resp 18  SpO2 100%  LMP 02/04/2015 (Approximate) Physical Exam  Constitutional: She is oriented to person, place, and time. She appears well-developed and well-nourished. No distress.  HENT:  Head: Normocephalic.  Eyes: Conjunctivae are normal.  Neck: Neck supple.  Cardiovascular: Normal rate, regular rhythm and normal heart  sounds.   Pulmonary/Chest: Effort normal and breath sounds normal. No respiratory distress. She has no wheezes. She has no rales.  Abdominal: Soft. Bowel sounds are normal. She exhibits no distension. There is no tenderness. There is no rebound.  Left CVA tenderness  Musculoskeletal: She exhibits no edema.   Neurological: She is alert and oriented to person, place, and time.  Skin: Skin is warm and dry.  Psychiatric: She has a normal mood and affect. Her behavior is normal.  Nursing note and vitals reviewed.   ED Course  Procedures (including critical care time) Labs Review Labs Reviewed  CBC WITH DIFFERENTIAL/PLATELET - Abnormal; Notable for the following:    RBC 3.84 (*)    Hemoglobin 11.4 (*)    HCT 35.3 (*)    All other components within normal limits  COMPREHENSIVE METABOLIC PANEL - Abnormal; Notable for the following:    Albumin 3.4 (*)    AST 10 (*)    ALT 8 (*)    All other components within normal limits  LIPASE, BLOOD - Abnormal; Notable for the following:    Lipase 13 (*)    All other components within normal limits  URINALYSIS, ROUTINE W REFLEX MICROSCOPIC (NOT AT [facility]) - Abnormal; Notable for the following:    Color, Urine AMBER (*)    APPearance CLOUDY (*)    Hgb urine dipstick LARGE (*)    Protein, ur 30 (*)    Leukocytes, UA SMALL (*)    All other components within normal limits  URINE MICROSCOPIC-ADD ON - Abnormal; Notable for the following:    Squamous Epithelial / LPF MANY (*)    Bacteria, UA FEW (*)    All other components within normal limits    Imaging Review US Abdomen Complete  02/21/2015   CLINICAL DATA:  Abdominal and left flank pain x3 days  EXAM: COMPLETE ABDOMINAL ULTRASOUND  COMPARISON:  None available  FINDINGS: Gallbladder: Physiologically distended without stones, wall thickening, or pericholecystic fluid. Sonographer reports no sonographic Murphy's sign.  Common bile duct:  Normal in caliber,  2-32mm diameter.  Liver: Homogeneous in echotexture without focal lesion or intrahepatic bile duct dilatation.  IVC:  Negative  Pancreas:  Negative  Spleen:  No focal lesion, craniocaudal 8.1cm in length.  Right Kidney:  No mass or hydronephrosis, 12.8cm in length.  Left Kidney: No lesion or hydronephrosis, 9.9 acute acutecm in length. Small echogenic focus  in the lower pole collecting system.  Abdominal aorta:  Negative  IMPRESSION: 1. Normal gallbladder. 2. Possible left nephrolithiasis, without hydronephrosis.   Electronically Signed   By: Corlis Leak M.D.   On: 02/21/2015 10:35     EKG Interpretation None      MDM   Final diagnoses:  Lt flank pain    patient diagnosed with all nephritis yesterday, symptoms now for 5 days, worsening pain. She denies any fever, no vomiting. She is currently on Cipro, just started on yesterday prior to then was on Keflex. Her abdomen is benign. Left CVA tenderness present. Differential includes worsening pyelonephritis versus possible kidney stone. Will get labs, CT abdomen and pelvis for evaluation. Will repeat urinalysis. Culture from yesterday has no growth.  3:15 PM Pt's pain improved with toradol. Pending CT  4:00 PM CT negative. Pt still feeling much better. I question possibility of her passing a stone while in ED, given a stone was seen on yesterdays [place]. UA pending. Pt is afebrile, no elevation in WBC count or neutrophils, no vomiting, I doubt severe  pyelonephritis. No other findings on CT to explain left flank pain however. Plan to d/c home, continue antibitoics. She received another dose of rocephin 1g IV while in ED. Instructed to increase norco frequency if needed. Plan to d/c home once UA is back. Signed out to PA Hedges pending UA   Jaynie Crumble, PA-C 02/23/15 1478  Mancel Bale, MD 02/23/15 1622

## 2015-08-28 ENCOUNTER — Emergency Department (INDEPENDENT_AMBULATORY_CARE_PROVIDER_SITE_OTHER)
Admission: EM | Admit: 2015-08-28 | Discharge: 2015-08-28 | Disposition: A | Discharge status: Home / Self Care | Payer: 59 | Source: Home / Self Care | Attending: Family Medicine | Admitting: Family Medicine

## 2015-08-28 ENCOUNTER — Encounter (HOSPITAL_COMMUNITY): Payer: Self-pay | Admitting: *Deleted

## 2015-08-28 ENCOUNTER — Other Ambulatory Visit (HOSPITAL_COMMUNITY)
Admission: RE | Admit: 2015-08-28 | Discharge: 2015-08-28 | Disposition: A | Discharge status: Home / Self Care | Payer: 59 | Source: Ambulatory Visit | Attending: Family Medicine | Admitting: Family Medicine

## 2015-08-28 DIAGNOSIS — N390 Urinary tract infection, site not specified: Secondary | ICD-10-CM | POA: Insufficient documentation

## 2015-08-28 LAB — POCT URINALYSIS DIP (DEVICE)
GLUCOSE, UA: NEGATIVE mg/dL
HGB URINE DIPSTICK: NEGATIVE
KETONES UR: 40 mg/dL — AB
Nitrite: NEGATIVE
Protein, ur: 30 mg/dL — AB
Urobilinogen, UA: 1 mg/dL (ref 0.0–1.0)
pH: 6 (ref 5.0–8.0)

## 2015-08-28 LAB — POCT PREGNANCY, URINE: Preg Test, Ur: NEGATIVE

## 2015-08-28 MED ORDER — CIPROFLOXACIN HCL 500 MG PO TABS: 500.0000 mg | ORAL_TABLET | Freq: Two times a day (BID) | ORAL | Status: DC

## 2015-08-28 NOTE — ED Provider Notes (Signed)
CSN: [id number]     Arrival date & time 08/28/15  1556 History   First MD Initiated Contact with Patient 08/28/15 1646     Chief Complaint  Patient presents with  . Urinary Tract Infection   (Consider location/radiation/quality/duration/timing/severity/associated sxs/prior Treatment) Patient is a 19 y.o. female presenting with dysuria. The history is provided by the patient and a parent.  Dysuria Pain quality:  Burning Pain severity:  Mild Onset quality:  Gradual Duration:  2 days Chronicity:  New Recent urinary tract infections: yes   Relieved by:  None tried Ineffective treatments:  None tried Urinary symptoms: frequent urination   Associated symptoms: no fever, no flank pain, no nausea and no vomiting     History reviewed. No pertinent past medical history. History reviewed. No pertinent past surgical history. Family History  Problem Relation Age of Onset  . Hypertension Mother   . Hypertension Father    Social History  Substance Use Topics  . Smoking status: Never Smoker   . Smokeless tobacco: Never Used  . Alcohol Use: No   OB History    No data available     Review of Systems  Constitutional: Negative for fever.  Gastrointestinal: Negative for nausea and vomiting.  Genitourinary: Positive for dysuria, urgency and frequency. Negative for flank pain.  All other systems reviewed and are negative.   Allergies  Review of patient's allergies indicates no known allergies.  Home Medications   Prior to Admission medications   Medication Sig Start Date End Date Taking? Authorizing Provider  ciprofloxacin (CIPRO) 500 MG tablet Take 1 tablet (500 mg total) by mouth 2 (two) times daily. 08/28/15   Linna HoffJames D Alfie Rideaux, MD  HYDROcodone-acetaminophen (NORCO) 5-325 MG per tablet Take 1 tablet by mouth every 6 (six) hours as needed for severe pain. 02/21/15   Pricilla LovelessScott Goldston, MD  ibuprofen (ADVIL,MOTRIN) 800 MG tablet Take 1 tablet (800 mg total) by mouth 3 (three) times  daily. Patient not taking: Reported on 02/22/2015 02/18/15   Roxy Horsemanobert Browning, PA-C  LO LOESTRIN FE 1 MG-10 MCG / 10 MCG tablet 1 tablet daily.  01/06/15   Historical Provider, MD  phenazopyridine (PYRIDIUM) 200 MG tablet Take 1 tablet (200 mg total) by mouth 3 (three) times daily. Patient not taking: Reported on 02/22/2015 02/18/15   Roxy Horsemanobert Browning, PA-C   Meds Ordered and Administered this Visit  Medications - No data to display  BP 147/91 mmHg  Pulse 83  Temp(Src) 97.7 F (36.5 C) (Oral)  Resp 16  SpO2 97%  LMP  (Within Months) No data found.   Physical Exam  Constitutional: She is oriented to person, place, and time. She appears well-developed and well-nourished. No distress.  Abdominal: Soft. Bowel sounds are normal. She exhibits no mass. There is no tenderness. There is no rebound and no guarding.  Neurological: She is alert and oriented to person, place, and time.  Skin: Skin is warm and dry.  Nursing note and vitals reviewed.   ED Course  Procedures (including critical care time)  Labs Review Labs Reviewed  POCT URINALYSIS DIP (DEVICE) - Abnormal; Notable for the following:    Bilirubin Urine SMALL (*)    Ketones, ur 40 (*)    Protein, ur 30 (*)    Leukocytes, UA TRACE (*)    All other components within normal limits  URINE CULTURE  POCT PREGNANCY, URINE    Imaging Review No results found.   Visual Acuity Review  Right Eye Distance:   Left Eye Distance:  Bilateral Distance:    Right Eye Near:   Left Eye Near:    Bilateral Near:         MDM   1. UTI (lower urinary tract infection)       Linna Hoff, MD 08/29/15 2015

## 2015-08-28 NOTE — Discharge Instructions (Signed)
Take all of medicine as directed, drink lots of fluids, see your doctor if further problems. °

## 2015-08-28 NOTE — ED Notes (Signed)
Pt  Reports     Symptoms  Of  Urinary    Tract       Infection         Symptoms  Started  Yesterday     Frequency  And  Burning  On  Urination

## 2015-08-30 LAB — URINE CULTURE
Culture: >=100000
SPECIAL REQUESTS: NORMAL

## 2015-12-06 ENCOUNTER — Emergency Department (HOSPITAL_COMMUNITY)
Admission: EM | Admit: 2015-12-06 | Discharge: 2015-12-07 | Disposition: A | Discharge status: Home / Self Care | Payer: 59 | Attending: Emergency Medicine | Admitting: Emergency Medicine

## 2015-12-06 ENCOUNTER — Emergency Department (HOSPITAL_COMMUNITY): Payer: 59

## 2015-12-06 ENCOUNTER — Encounter (HOSPITAL_COMMUNITY): Payer: Self-pay | Admitting: Emergency Medicine

## 2015-12-06 DIAGNOSIS — Z79818 Long term (current) use of other agents affecting estrogen receptors and estrogen levels: Secondary | ICD-10-CM | POA: Insufficient documentation

## 2015-12-06 DIAGNOSIS — R079 Chest pain, unspecified: Secondary | ICD-10-CM | POA: Insufficient documentation

## 2015-12-06 DIAGNOSIS — R0602 Shortness of breath: Secondary | ICD-10-CM | POA: Diagnosis not present

## 2015-12-06 DIAGNOSIS — R42 Dizziness and giddiness: Secondary | ICD-10-CM | POA: Insufficient documentation

## 2015-12-06 DIAGNOSIS — M545 Low back pain: Secondary | ICD-10-CM | POA: Insufficient documentation

## 2015-12-06 DIAGNOSIS — Z792 Long term (current) use of antibiotics: Secondary | ICD-10-CM | POA: Insufficient documentation

## 2015-12-06 DIAGNOSIS — Z3202 Encounter for pregnancy test, result negative: Secondary | ICD-10-CM | POA: Insufficient documentation

## 2015-12-06 LAB — I-STAT BETA HCG BLOOD, ED (MC, WL, AP ONLY): I-stat hCG, quantitative: <5 m[IU]/mL (ref <5)

## 2015-12-06 LAB — I-STAT TROPONIN, ED: Troponin i, poc: 0 ng/mL (ref 0.00–0.08)

## 2015-12-06 NOTE — ED Notes (Signed)
Patient c/o mid lower chest pain. States it started suddenly while watching TV. Patient reports this is not the first time shes had this pain. Patient describes pain as sharp and pressure that is radiating to her back.

## 2015-12-07 LAB — CBC
HEMATOCRIT: 41.9 % (ref 36.0–46.0)
Hemoglobin: 14.1 g/dL (ref 12.0–15.0)
MCH: 30.6 pg (ref 26.0–34.0)
MCHC: 33.7 g/dL (ref 30.0–36.0)
MCV: 90.9 fL (ref 78.0–100.0)
Platelets: 252 10*3/uL (ref 150–400)
RBC: 4.61 MIL/uL (ref 3.87–5.11)
RDW: 13 % (ref 11.5–15.5)
WBC: 6.2 10*3/uL (ref 4.0–10.5)

## 2015-12-07 LAB — BASIC METABOLIC PANEL
Anion gap: 9 (ref 5–15)
BUN: 20 mg/dL (ref 6–20)
CHLORIDE: 106 mmol/L (ref 101–111)
CO2: 28 mmol/L (ref 22–32)
Calcium: 10.4 mg/dL — ABNORMAL HIGH (ref 8.9–10.3)
Creatinine, Ser: 0.95 mg/dL (ref 0.44–1.00)
Glucose, Bld: 70 mg/dL (ref 65–99)
POTASSIUM: 3.9 mmol/L (ref 3.5–5.1)
SODIUM: 143 mmol/L (ref 135–145)

## 2015-12-07 NOTE — Discharge Instructions (Signed)
Nonspecific Chest Pain  °Chest pain can be caused by many different conditions. There is always a chance that your pain could be related to something serious, such as a heart attack or a blood clot in your lungs. Chest pain can also be caused by conditions that are not life-threatening. If you have chest pain, it is very important to follow up with your health care provider. °CAUSES  °Chest pain can be caused by: °· Heartburn. °· Pneumonia or bronchitis. °· Anxiety or stress. °· Inflammation around your heart (pericarditis) or lung (pleuritis or pleurisy). °· A blood clot in your lung. °· A collapsed lung (pneumothorax). It can develop suddenly on its own (spontaneous pneumothorax) or from trauma to the chest. °· Shingles infection (varicella-zoster virus). °· Heart attack. °· Damage to the bones, muscles, and cartilage that make up your chest wall. This can include: °· Bruised bones due to injury. °· Strained muscles or cartilage due to frequent or repeated coughing or overwork. °· Fracture to one or more ribs. °· Sore cartilage due to inflammation (costochondritis). °RISK FACTORS  °Risk factors for chest pain may include: °· Activities that increase your risk for trauma or injury to your chest. °· Respiratory infections or conditions that cause frequent coughing. °· Medical conditions or overeating that can cause heartburn. °· Heart disease or family history of heart disease. °· Conditions or health behaviors that increase your risk of developing a blood clot. °· Having had chicken pox (varicella zoster). °SIGNS AND SYMPTOMS °Chest pain can feel like: °· Burning or tingling on the surface of your chest or deep in your chest. °· Crushing, pressure, aching, or squeezing pain. °· Dull or sharp pain that is worse when you move, cough, or take a deep breath. °· Pain that is also felt in your back, neck, shoulder, or arm, or pain that spreads to any of these areas. °Your chest pain may come and go, or it may stay  constant. °DIAGNOSIS °Lab tests or other studies may be needed to find the cause of your pain. Your health care provider may have you take a test called an ambulatory ECG (electrocardiogram). An ECG records your heartbeat patterns at the time the test is performed. You may also have other tests, such as: °· Transthoracic echocardiogram (TTE). During echocardiography, sound waves are used to create a picture of all of the heart structures and to look at how blood flows through your heart. °· Transesophageal echocardiogram (TEE). This is a more advanced imaging test that obtains images from inside your body. It allows your health care provider to see your heart in finer detail. °· Cardiac monitoring. This allows your health care provider to monitor your heart rate and rhythm in real time. °· Holter monitor. This is a portable device that records your heartbeat and can help to diagnose abnormal heartbeats. It allows your health care provider to track your heart activity for several days, if needed. °· Stress tests. These can be done through exercise or by taking medicine that makes your heart beat more quickly. °· Blood tests. °· Imaging tests. °TREATMENT  °Your treatment depends on what is causing your chest pain. Treatment may include: °· Medicines. These may include: °· Acid blockers for heartburn. °· Anti-inflammatory medicine. °· Pain medicine for inflammatory conditions. °· Antibiotic medicine, if an infection is present. °· Medicines to dissolve blood clots. °· Medicines to treat coronary artery disease. °· Supportive care for conditions that do not require medicines. This may include: °· Resting. °· Applying heat   or cold packs to injured areas. °· Limiting activities until pain decreases. °HOME CARE INSTRUCTIONS °· If you were prescribed an antibiotic medicine, finish it all even if you start to feel better. °· Avoid any activities that bring on chest pain. °· Do not use any tobacco products, including  cigarettes, chewing tobacco, or electronic cigarettes. If you need help quitting, ask your health care provider. °· Do not drink alcohol. °· Take medicines only as directed by your health care provider. °· Keep all follow-up visits as directed by your health care provider. This is important. This includes any further testing if your chest pain does not go away. °· If heartburn is the cause for your chest pain, you may be told to keep your head raised (elevated) while sleeping. This reduces the chance that acid will go from your stomach into your esophagus. °· Make lifestyle changes as directed by your health care provider. These may include: °¨ Getting regular exercise. Ask your health care provider to suggest some activities that are safe for you. °¨ Eating a heart-healthy diet. A registered dietitian can help you to learn healthy eating options. °¨ Maintaining a healthy weight. °¨ Managing diabetes, if necessary. °¨ Reducing stress. °SEEK MEDICAL CARE IF: °· Your chest pain does not go away after treatment. °· You have a rash with blisters on your chest. °· You have a fever. °SEEK IMMEDIATE MEDICAL CARE IF:  °· Your chest pain is worse. °· You have an increasing cough, or you cough up blood. °· You have severe abdominal pain. °· You have severe weakness. °· You faint. °· You have chills. °· You have sudden, unexplained chest discomfort. °· You have sudden, unexplained discomfort in your arms, back, neck, or jaw. °· You have shortness of breath at any time. °· You suddenly start to sweat, or your skin gets clammy. °· You feel nauseous or you vomit. °· You suddenly feel light-headed or dizzy. °· Your heart begins to beat quickly, or it feels like it is skipping beats. °These symptoms may represent a serious problem that is an emergency. Do not wait to see if the symptoms will go away. Get medical help right away. Call your local emergency services (911 in the [place] Do not drive yourself to the hospital. °  °This  information is not intended to replace advice given to you by your health care provider. Make sure you discuss any questions you have with your health care provider. °  °Document Released: 06/22/2005 Document Revised: 10/03/2014 Document Reviewed: 04/18/2014 °Elsevier Interactive Patient Education ©2016 [employer] ° °Aspirin and Your Heart ° Aspirin is a medicine that affects the way blood clots. Aspirin can be used to help reduce the risk of blood clots, heart attacks, and other heart-related problems.  °SHOULD I TAKE ASPIRIN? °Your health care provider will help you determine whether it is safe and beneficial for you to take aspirin daily. Taking aspirin daily may be beneficial if you: °· Have had a heart attack or chest pain. °· Have undergone open heart surgery such as coronary artery bypass surgery (CABG). °· Have had coronary angioplasty. °· Have experienced a stroke or transient ischemic attack (TIA). °· Have peripheral vascular disease (PVD). °· Have chronic heart rhythm problems such as atrial fibrillation. °ARE THERE ANY RISKS OF TAKING ASPIRIN DAILY? °Daily use of aspirin can increase your risk of side effects. Some of these include: °· Bleeding. Bleeding problems can be minor or serious. An example of a minor problem is a   cut that does not stop bleeding. An example of a more serious problem is stomach bleeding or bleeding into the brain. Your risk of bleeding is increased if you are also taking non-steroidal anti-inflammatory medicine (NSAIDs). °· Increased bruising. °· Upset stomach. °· An allergic reaction. People who have nasal polyps have an increased risk of developing an aspirin allergy. °WHAT ARE SOME GUIDELINES I SHOULD FOLLOW WHEN TAKING ASPIRIN?  °· Take aspirin only as directed by your health care provider. Make sure you understand how much you should take and what form you should take. The two forms of aspirin are: °¨ Non-enteric-coated. This type of aspirin does not have a coating and is  absorbed quickly. Non-enteric-coated aspirin is usually recommended for people with chest pain. This type of aspirin also comes in a chewable form. °¨ Enteric-coated. This type of aspirin has a special coating that releases the medicine very slowly. Enteric-coated aspirin causes less stomach upset than non-enteric-coated aspirin. This type of aspirin should not be chewed or crushed. °· Drink alcohol in moderation. Drinking alcohol increases your risk of bleeding. °WHEN SHOULD I SEEK MEDICAL CARE?  °· You have unusual bleeding or bruising. °· You have stomach pain. °· You have an allergic reaction. Symptoms of an allergic reaction include: °¨ Hives. °¨ Itchy skin. °¨ Swelling of the lips, tongue, or face. °· You have ringing in your ears. °WHEN SHOULD I SEEK IMMEDIATE MEDICAL CARE?  °· Your bowel movements are bloody, dark red, or black in color. °· You vomit or cough up blood. °· You have blood in your urine. °· You cough, wheeze, or feel short of breath. °If you have any of the following symptoms, this is an emergency. Do not wait to see if the pain will go away. Get medical help at once. Call your local emergency services (911 in the [place] Do not drive yourself to the hospital. °· You have severe chest pain, especially if the pain is crushing or pressure-like and spreads to the arms, back, neck, or jaw.  °· You have stroke-like symptoms, such as:   °¨ Loss of vision.   °¨ Difficulty talking.   °¨ Numbness or weakness on one side of your body.   °¨ Numbness or weakness in your arm or leg.   °¨ Not thinking clearly or feeling confused.   °  °This information is not intended to replace advice given to you by your health care provider. Make sure you discuss any questions you have with your health care provider. °  °Document Released: 08/25/2008 Document Revised: 10/03/2014 Document Reviewed: 12/18/2013 °Elsevier Interactive Patient Education ©2016 [employer] ° °

## 2015-12-07 NOTE — ED Provider Notes (Signed)
CSN: [id number]     Arrival date & time 12/06/15  2226 History   By signing my name below, I, Linus GalasMaharshi Patel, attest that this documentation has been prepared under the direction and in the presence of Raeford RazorStephen Marjani Kobel, MD. Electronically Signed: Linus GalasMaharshi Patel, ED Scribe. 12/07/2015. 3:00 AM.   Chief Complaint  Patient presents with  . Chest Pain    mid, lower    The history is provided by the patient. No language interpreter was used.   HPI Comments: [place] Lattie CornsMorse is a 20 y.o. female with no PMHx who presents to the Emergency Department complaining of intermittent sharp chest pain that began 1 day ago. Pt also reports lower back pain, SOB secondary to pain and lightheadedness. She described her chest pain as a pressure and states each episode of pain lasts a few minutes. Pt denies any aggravating or alleviating factors. Pt denies any OTC medication for pain.  Pt denies any diaphoresis, nausea, abdominal pain, leg swelling or any other associated symptoms at this time.   History reviewed. No pertinent past medical history. History reviewed. No pertinent past surgical history. Family History  Problem Relation Age of Onset  . Hypertension Mother   . Hypertension Father    Social History  Substance Use Topics  . Smoking status: Never Smoker   . Smokeless tobacco: Never Used  . Alcohol Use: No   OB History    No data available     Review of Systems  Constitutional: Negative for diaphoresis.  Respiratory: Positive for shortness of breath.   Cardiovascular: Positive for chest pain. Negative for leg swelling.  Gastrointestinal: Negative for nausea and abdominal pain.  Musculoskeletal: Positive for back pain.  Neurological: Positive for light-headedness.  All other systems reviewed and are negative.   Allergies  Review of patient's allergies indicates no known allergies.  Home Medications   Prior to Admission medications   Medication Sig Start Date End Date Taking? Authorizing  Provider  LO LOESTRIN FE 1 MG-10 MCG / 10 MCG tablet 1 tablet daily.  01/06/15  Yes Historical Provider, MD  ciprofloxacin (CIPRO) 500 MG tablet Take 1 tablet (500 mg total) by mouth 2 (two) times daily. 08/28/15   [name], MD   BP 113/71 mmHg  Pulse 77  Temp(Src) 98.2 F (36.8 C) (Oral)  Resp 16  SpO2 100%  LMP 06/08/2015   Physical Exam  Constitutional: She is oriented to person, place, and time. She appears well-developed and well-nourished.  HENT:  Head: Normocephalic and atraumatic.  Mouth/Throat: Oropharynx is clear and moist and mucous membranes are normal.  Neck: Normal range of motion. Neck supple.  Cardiovascular: Normal rate, regular rhythm, normal heart sounds and intact distal pulses.   Pulmonary/Chest: Effort normal and breath sounds normal. No respiratory distress.  Abdominal: Soft. Bowel sounds are normal. She exhibits no distension.  Musculoskeletal: Normal range of motion. She exhibits no edema or tenderness.  Lower extremities symmetric as compared to each other. No calf tenderness. Negative Homan's. No palpable cords.   Neurological: She is alert and oriented to person, place, and time. She exhibits normal muscle tone. Coordination normal.  Skin: Skin is warm and dry.  Psychiatric: She has a normal mood and affect.  Nursing note and vitals reviewed.   ED Course  Procedures   DIAGNOSTIC STUDIES: Oxygen Saturation is 100% on room air, normal by my interpretation.    COORDINATION OF CARE: 2:52 AM Will order CXR, blood work, and EKG. Discussed treatment plan with pt  at bedside and pt agreed to plan. Return precaution given.    Labs Review Labs Reviewed  BASIC METABOLIC PANEL - Abnormal; Notable for the following:    Calcium 10.4 (*)    All other components within normal limits  CBC  I-STAT TROPOININ, ED  I-STAT BETA HCG BLOOD, ED ([facility], [facility], [facility] ONLY)    Imaging Review Dg Chest 2 View  12/07/2015  CLINICAL DATA:  Sharp epigastric and chest pain  beginning today. Similar symptoms previously. EXAM: CHEST  2 VIEW COMPARISON:  None. FINDINGS: Cardiomediastinal silhouette is normal. The lungs are clear without pleural effusions or focal consolidations. Trachea projects midline and there is no pneumothorax. Soft tissue planes and included osseous structures are non-suspicious. IMPRESSION: Normal chest. Electronically Signed   By: Awilda Metro M.D.   On: 12/07/2015 00:17   I have personally reviewed and evaluated these images and lab results as part of my medical decision-making.   EKG Interpretation   Date/Time:  Sunday December 06 2015 22:36:04 EDT Ventricular Rate:  89 PR Interval:  143 QRS Duration: 76 QT Interval:  351 QTC Calculation: 427 R Axis:   73 Text Interpretation:  Sinus rhythm ST elevation. consider pericarditis No  old tracing to compare Confirmed by Juleen China  MD, Jazzlynn Rawe [phone]) on 12/07/2015  3:00:46 AM Also confirmed by Juleen China  MD, Talene Glastetter ([id number]), editor [place]   CCT, BEVERLY ([id number])  on 12/07/2015 7:16:37 AM      MDM   Final diagnoses:  Chest pain, unspecified chest pain type    19yF with CP. Sharp pain which sometimes feels in back. Pericarditis? No rub appreciated. Not positional. No recent viral type symptoms. Consider PE, but doubt. No respiratory complaints. No sign/ssymptoms of DVT. Normal vitals signs. Doubt ACS or dissection.    I personally preformed the services scribed in my presence. The recorded information has been reviewed is accurate. Raeford Razor, MD.     Raeford Razor, MD 12/11/15 [id number]

## 2015-12-07 NOTE — ED Notes (Signed)
Pt rounded on while waiting.  Vital signs obtained.  Pain reassessed.  Pain medication offered.  Pt declined.  Warm blanket given and offered to visitor as well.

## 2016-02-29 ENCOUNTER — Encounter (HOSPITAL_COMMUNITY): Payer: Self-pay | Admitting: Emergency Medicine

## 2016-02-29 DIAGNOSIS — O219 Vomiting of pregnancy, unspecified: Secondary | ICD-10-CM | POA: Diagnosis not present

## 2016-02-29 DIAGNOSIS — Z79899 Other long term (current) drug therapy: Secondary | ICD-10-CM | POA: Diagnosis not present

## 2016-02-29 DIAGNOSIS — Z792 Long term (current) use of antibiotics: Secondary | ICD-10-CM | POA: Diagnosis not present

## 2016-02-29 DIAGNOSIS — O9989 Other specified diseases and conditions complicating pregnancy, childbirth and the puerperium: Secondary | ICD-10-CM | POA: Diagnosis present

## 2016-02-29 DIAGNOSIS — Z3A01 Less than 8 weeks gestation of pregnancy: Secondary | ICD-10-CM | POA: Diagnosis not present

## 2016-02-29 DIAGNOSIS — R1084 Generalized abdominal pain: Secondary | ICD-10-CM | POA: Insufficient documentation

## 2016-02-29 DIAGNOSIS — R197 Diarrhea, unspecified: Secondary | ICD-10-CM | POA: Diagnosis not present

## 2016-02-29 DIAGNOSIS — O209 Hemorrhage in early pregnancy, unspecified: Secondary | ICD-10-CM | POA: Diagnosis not present

## 2016-02-29 DIAGNOSIS — O4691 Antepartum hemorrhage, unspecified, first trimester: Secondary | ICD-10-CM | POA: Diagnosis not present

## 2016-02-29 LAB — URINALYSIS, ROUTINE W REFLEX MICROSCOPIC
GLUCOSE, UA: NEGATIVE mg/dL
Leukocytes, UA: NEGATIVE
Nitrite: NEGATIVE
PROTEIN: 30 mg/dL — AB
Specific Gravity, Urine: 1.034 — ABNORMAL HIGH (ref 1.005–1.030)
pH: 6 (ref 5.0–8.0)

## 2016-02-29 LAB — COMPREHENSIVE METABOLIC PANEL
ALBUMIN: 4.7 g/dL (ref 3.5–5.0)
ALK PHOS: 63 U/L (ref 38–126)
ALT: 13 U/L — AB (ref 14–54)
AST: 18 U/L (ref 15–41)
Anion gap: 10 (ref 5–15)
BUN: 14 mg/dL (ref 6–20)
CALCIUM: 9.9 mg/dL (ref 8.9–10.3)
CHLORIDE: 104 mmol/L (ref 101–111)
CO2: 20 mmol/L — AB (ref 22–32)
CREATININE: 0.79 mg/dL (ref 0.44–1.00)
GFR calc Af Amer: >60 mL/min (ref >60)
GFR calc non Af Amer: >60 mL/min (ref >60)
Glucose, Bld: 94 mg/dL (ref 65–99)
Potassium: 3.4 mmol/L — ABNORMAL LOW (ref 3.5–5.1)
SODIUM: 134 mmol/L — AB (ref 135–145)
Total Bilirubin: 1.2 mg/dL (ref 0.3–1.2)
Total Protein: 7.9 g/dL (ref 6.5–8.1)

## 2016-02-29 LAB — CBC
HCT: 37.9 % (ref 36.0–46.0)
Hemoglobin: 13 g/dL (ref 12.0–15.0)
MCH: 30.4 pg (ref 26.0–34.0)
MCHC: 34.3 g/dL (ref 30.0–36.0)
MCV: 88.6 fL (ref 78.0–100.0)
PLATELETS: 231 10*3/uL (ref 150–400)
RBC: 4.28 MIL/uL (ref 3.87–5.11)
RDW: 12.4 % (ref 11.5–15.5)
WBC: 8 10*3/uL (ref 4.0–10.5)

## 2016-02-29 LAB — POC URINE PREG, ED: PREG TEST UR: POSITIVE — AB

## 2016-02-29 LAB — URINE MICROSCOPIC-ADD ON

## 2016-02-29 LAB — LIPASE, BLOOD: Lipase: 26 U/L (ref 11–51)

## 2016-02-29 NOTE — ED Notes (Signed)
Patient here with nausea, vomiting and diarrhea for the last four days.

## 2016-03-01 ENCOUNTER — Encounter (HOSPITAL_COMMUNITY): Payer: Self-pay | Admitting: *Deleted

## 2016-03-01 ENCOUNTER — Encounter (HOSPITAL_COMMUNITY): Payer: Self-pay

## 2016-03-01 ENCOUNTER — Emergency Department (HOSPITAL_COMMUNITY): Payer: 59

## 2016-03-01 ENCOUNTER — Emergency Department (HOSPITAL_COMMUNITY)
Admission: EM | Admit: 2016-03-01 | Discharge: 2016-03-01 | Disposition: A | Discharge status: Home / Self Care | Payer: 59 | Source: Home / Self Care

## 2016-03-01 ENCOUNTER — Emergency Department (HOSPITAL_COMMUNITY)
Admission: EM | Admit: 2016-03-01 | Discharge: 2016-03-01 | Disposition: A | Discharge status: Home / Self Care | Payer: 59 | Attending: Emergency Medicine | Admitting: Emergency Medicine

## 2016-03-01 ENCOUNTER — Inpatient Hospital Stay (HOSPITAL_COMMUNITY)
Admission: AD | Admit: 2016-03-01 | Discharge: 2016-03-01 | Disposition: A | Discharge status: Home / Self Care | Payer: 59 | Source: Ambulatory Visit | Attending: Obstetrics and Gynecology | Admitting: Obstetrics and Gynecology

## 2016-03-01 DIAGNOSIS — N760 Acute vaginitis: Secondary | ICD-10-CM | POA: Diagnosis not present

## 2016-03-01 DIAGNOSIS — Z87891 Personal history of nicotine dependence: Secondary | ICD-10-CM | POA: Insufficient documentation

## 2016-03-01 DIAGNOSIS — O4691 Antepartum hemorrhage, unspecified, first trimester: Secondary | ICD-10-CM

## 2016-03-01 DIAGNOSIS — Z3A01 Less than 8 weeks gestation of pregnancy: Secondary | ICD-10-CM | POA: Diagnosis not present

## 2016-03-01 DIAGNOSIS — R42 Dizziness and giddiness: Secondary | ICD-10-CM | POA: Insufficient documentation

## 2016-03-01 DIAGNOSIS — R5383 Other fatigue: Secondary | ICD-10-CM | POA: Insufficient documentation

## 2016-03-01 DIAGNOSIS — O23591 Infection of other part of genital tract in pregnancy, first trimester: Secondary | ICD-10-CM | POA: Insufficient documentation

## 2016-03-01 DIAGNOSIS — Z5321 Procedure and treatment not carried out due to patient leaving prior to being seen by health care provider: Secondary | ICD-10-CM

## 2016-03-01 DIAGNOSIS — R1084 Generalized abdominal pain: Secondary | ICD-10-CM

## 2016-03-01 DIAGNOSIS — N939 Abnormal uterine and vaginal bleeding, unspecified: Secondary | ICD-10-CM

## 2016-03-01 DIAGNOSIS — O209 Hemorrhage in early pregnancy, unspecified: Secondary | ICD-10-CM | POA: Insufficient documentation

## 2016-03-01 DIAGNOSIS — B9689 Other specified bacterial agents as the cause of diseases classified elsewhere: Secondary | ICD-10-CM

## 2016-03-01 LAB — WET PREP, GENITAL
SPERM: NONE SEEN
Trich, Wet Prep: NONE SEEN
Yeast Wet Prep HPF POC: NONE SEEN

## 2016-03-01 LAB — URINALYSIS, ROUTINE W REFLEX MICROSCOPIC
Glucose, UA: NEGATIVE mg/dL
KETONES UR: 40 mg/dL — AB
Leukocytes, UA: NEGATIVE
NITRITE: NEGATIVE
Protein, ur: 30 mg/dL — AB
Specific Gravity, Urine: 1.025 (ref 1.005–1.030)
pH: 6 (ref 5.0–8.0)

## 2016-03-01 LAB — URINE MICROSCOPIC-ADD ON

## 2016-03-01 LAB — I-STAT BETA HCG BLOOD, ED (MC, WL, AP ONLY)

## 2016-03-01 LAB — ABO/RH: ABO/RH(D): A POS

## 2016-03-01 MED ORDER — PROMETHAZINE HCL 25 MG PO TABS: 25.0000 mg | ORAL_TABLET | Freq: Four times a day (QID) | ORAL | Status: DC | PRN

## 2016-03-01 MED ORDER — METRONIDAZOLE 500 MG PO TABS: 500.0000 mg | ORAL_TABLET | Freq: Two times a day (BID) | ORAL | Status: DC

## 2016-03-01 MED ORDER — CONCEPT OB 130-92.4-1 MG PO CAPS: 1.0000 | ORAL_CAPSULE | Freq: Every day | ORAL | Status: DC

## 2016-03-01 MED ORDER — PROMETHAZINE HCL 25 MG RE SUPP: 25.0000 mg | Freq: Four times a day (QID) | RECTAL | Status: DC | PRN

## 2016-03-01 MED ORDER — PROMETHAZINE HCL 25 MG/ML IJ SOLN
25.0000 mg | Freq: Once | INTRAMUSCULAR | Status: AC
  Administered 2016-03-01: 25 mg via INTRAMUSCULAR
  Filled 2016-03-01: qty 1

## 2016-03-01 MED ORDER — PROMETHAZINE HCL 25 MG PO TABS
25.0000 mg | ORAL_TABLET | Freq: Once | ORAL | Status: AC
  Administered 2016-03-01: 25 mg via ORAL
  Filled 2016-03-01: qty 1

## 2016-03-01 NOTE — MAU Note (Signed)
4-5 days ago had vomiting, had bleeding starting 2 days ago.  Bleeding like a period, doesn't remember LMP.  [facility], Positive pregnancy test and u/s done.  Told her to follow up in three days.  She didn't want to wait and [facility].

## 2016-03-01 NOTE — ED Provider Notes (Signed)
CSN: [id number]     Arrival date & time 02/29/16  2131 History  By signing my name below, I, [name], attest that this documentation has been prepared under the direction and in the presence of [name], MD. Electronically Signed: Angelene GiovanniEmmanuella Mensah, ED Scribe. 03/01/2016. 2:32 AM.    Chief Complaint  Patient presents with  . Abdominal Pain   The history is provided by the patient. No language interpreter was used.   HPI Comments: Melissa Kemp is a 20 y.o. female who presents to the Emergency Department complaining of gradually worsening pulsating upper abdominal pain onset 5 days ago. She reports multiple episodes of non-bloody vomiting and non-bloody diarrhea. She adds that she started bleeding last night. No alleviating factors noted. Pt has not tried any medications PTA. She states that she was on birth control several months ago but stopped taking it because she missed doses and is unsure of last menstrual cycle. She denies any vaginal discharge.    History reviewed. No pertinent past medical history. History reviewed. No pertinent past surgical history. Family History  Problem Relation Age of Onset  . Hypertension Mother   . Hypertension Father    Social History  Substance Use Topics  . Smoking status: Never Smoker   . Smokeless tobacco: Never Used  . Alcohol Use: No   OB History    No data available     Review of Systems  Gastrointestinal: Positive for nausea, vomiting, abdominal pain and diarrhea.  Genitourinary: Positive for vaginal bleeding. Negative for vaginal discharge.  All other systems reviewed and are negative.     Allergies  Review of patient's allergies indicates no known allergies.  Home Medications   Prior to Admission medications   Medication Sig Start Date End Date Taking? Authorizing Provider  ciprofloxacin (CIPRO) 500 MG tablet Take 1 tablet (500 mg total) by mouth 2 (two) times daily. 08/28/15   [name], MD  LO LOESTRIN FE 1 WU-98MG-10  MCG / 10 MCG tablet 1 tablet daily.  01/06/15   Historical Provider, MD   BP 121/63 mmHg  Pulse 80  Temp(Src) 98.5 F (36.9 C) (Oral)  Resp 16  SpO2 100%  LMP 02/28/2016 (Exact Date) Physical Exam  Constitutional: She is oriented to person, place, and time. She appears well-developed and well-nourished. No distress.  HENT:  Head: Normocephalic and atraumatic.  Nose: Nose normal.  Mouth/Throat: Oropharynx is clear and moist. No oropharyngeal exudate.  Eyes: Conjunctivae and EOM are normal. Pupils are equal, round, and reactive to light. No scleral icterus.  Neck: Normal range of motion. Neck supple. No JVD present. No tracheal deviation present. No thyromegaly present.  Cardiovascular: Normal rate, regular rhythm and normal heart sounds.  Exam reveals no gallop and no friction rub.   No murmur heard. Pulmonary/Chest: Effort normal and breath sounds normal. No respiratory distress. She has no wheezes. She exhibits no tenderness.  Abdominal: Soft. Bowel sounds are normal. She exhibits no distension and no mass. There is no tenderness. There is no rebound and no guarding.  Musculoskeletal: Normal range of motion. She exhibits no edema or tenderness.  Lymphadenopathy:    She has no cervical adenopathy.  Neurological: She is alert and oriented to person, place, and time. No cranial nerve deficit. She exhibits normal muscle tone.  Skin: Skin is warm and dry. No rash noted. No erythema. No pallor.  Nursing note and vitals reviewed.   ED Course  Procedures (including critical care time) DIAGNOSTIC STUDIES: Oxygen Saturation is  100% on RA, normal by my interpretation.    COORDINATION OF CARE: 2:21 AM- Pt advised of plan for treatment and pt agrees. Pt informed of positive pregnancy result. She will receive an ultrasound for further evaluation.    Labs Review Labs Reviewed  COMPREHENSIVE METABOLIC PANEL - Abnormal; Notable for the following:    Sodium 134 (*)    Potassium 3.4 (*)    CO2  20 (*)    ALT 13 (*)    All other components within normal limits  URINALYSIS, ROUTINE W REFLEX MICROSCOPIC (NOT AT [facility]) - Abnormal; Notable for the following:    Color, Urine AMBER (*)    APPearance CLOUDY (*)    Specific Gravity, Urine 1.034 (*)    Hgb urine dipstick SMALL (*)    Bilirubin Urine SMALL (*)    Ketones, ur >80 (*)    Protein, ur 30 (*)    All other components within normal limits  URINE MICROSCOPIC-ADD ON - Abnormal; Notable for the following:    Squamous Epithelial / LPF 0-5 (*)    Bacteria, UA FEW (*)    All other components within normal limits  POC URINE PREG, ED - Abnormal; Notable for the following:    Preg Test, Ur POSITIVE (*)    All other components within normal limits  I-STAT BETA HCG BLOOD, ED ([facility], [facility], [facility] ONLY) - Abnormal; Notable for the following:    I-stat hCG, quantitative >2000.0 (*)    All other components within normal limits  LIPASE, BLOOD  CBC    Imaging Review US Ob Comp Less 14 Wks  03/01/2016  CLINICAL DATA:  Vaginal bleeding and first-trimester pregnancy. EXAM: OBSTETRIC <14 WK [place] AND TRANSVAGINAL OB US TECHNIQUE: Both transabdominal and transvaginal ultrasound examinations were performed for complete evaluation of the gestation as well as the maternal uterus, adnexal regions, and pelvic cul-de-sac. Transvaginal technique was performed to assess early pregnancy. COMPARISON:  None. FINDINGS: Intrauterine gestational sac: Present Yolk sac:  Present Embryo:  Present Cardiac Activity: Normal cardiac activity not yet seen CRL:  4.6  mm   6 w   1 d                  [place] EDC: 10/24/2016 Subchorionic hemorrhage: 2 cm predominant hyperechoic area with central hypoechoic 8 mm component. Especially given the history this is most likely a mixed echogenicity hematoma. Failed, partially collapsed second gestational sac is an alternate consideration. Maternal uterus/adnexae: Simple 3 cm cyst on the right. IMPRESSION: 1. Intrauterine pregnancy measuring 6 weeks 1  day. Normal cardiac activity is not yet seen. 2. 2 cm mixed echogenicity structure in the endometrial cavity is likely a hematoma. 3. Recommend surveillance. Electronically Signed   By: Marnee Spring M.D.   On: 03/01/2016 03:51   US Ob Transvaginal  03/01/2016  CLINICAL DATA:  Vaginal bleeding and first-trimester pregnancy. EXAM: OBSTETRIC <14 WK [place] AND TRANSVAGINAL OB US TECHNIQUE: Both transabdominal and transvaginal ultrasound examinations were performed for complete evaluation of the gestation as well as the maternal uterus, adnexal regions, and pelvic cul-de-sac. Transvaginal technique was performed to assess early pregnancy. COMPARISON:  None. FINDINGS: Intrauterine gestational sac: Present Yolk sac:  Present Embryo:  Present Cardiac Activity: Normal cardiac activity not yet seen CRL:  4.6  mm   6 w   1 d                  [place] EDC: 10/24/2016 Subchorionic hemorrhage: 2 cm predominant hyperechoic area with  central hypoechoic 8 mm component. Especially given the history this is most likely a mixed echogenicity hematoma. Failed, partially collapsed second gestational sac is an alternate consideration. Maternal uterus/adnexae: Simple 3 cm cyst on the right. IMPRESSION: 1. Intrauterine pregnancy measuring 6 weeks 1 day. Normal cardiac activity is not yet seen. 2. 2 cm mixed echogenicity structure in the endometrial cavity is likely a hematoma. 3. Recommend surveillance. Electronically Signed   By: Marnee Spring M.D.   On: 03/01/2016 03:51     Tomasita Crumble, MD has personally reviewed and evaluated these images and lab results as part of his medical decision-making.   EKG Interpretation None      MDM   Final diagnoses:  None    Patient presents to the ED for abdominal pain.  Results show she is pregnant, patient was not aware.  I had a long discussion with the patient about pregnancy and what to expect.  She experienced vaginal bleeding so [place] was obtained which shows an abnormal fetal HR.  I spoke  with Dr. Grace Isaac with radiology who states this could simply be an early pregnancy vs a miscarraige that is going to happen.  This was relayed to the patient.  She was given DC instructions for abdominal pain because she does not want family to know.  OB/GYN follow up was provided and she was told to get repeat US in 3 days.  She appears well and in NAD.  Vs remain within her normal limits and she is safe for DC.   I personally performed the services described in this documentation, which was scribed in my presence. The recorded information has been reviewed and is accurate.     Tomasita Crumble, MD 03/01/16 [phone]

## 2016-03-01 NOTE — ED Notes (Signed)
Pt advised registration that she was leaving and no longer wanted to wait to be seen

## 2016-03-01 NOTE — ED Notes (Addendum)
Pt states that [facility]; pt was brought in by EMS tonight for complaints of dizziness and fatigue; pt states that she was at her friends house and felt dizzy; pt states that the episode lasted a few minutes; pt denies dizziness currently; [facility]

## 2016-03-01 NOTE — MAU Provider Note (Signed)
Chief Complaint: Vaginal Bleeding and Abdominal Pain   First Provider Initiated Contact with Patient 03/01/16 1410     SUBJECTIVE HPI: Melissa Kemp is a 20 y.o. G2P0010 at [redacted]w[redacted]d who presents to Maternity Admissions reporting mild vaginal bleeding x 2 days. She reports "heavier bleeding like a period" in the mornings, but only spotting in the afternoon. Notes changing a pad twice per day. Denies any passage of clots or tissue. Denies any change in bleeding severity or frequency since onset. Patient has also been experiencing mild, intermittent lower abdominal cramping since onset of bleeding. She confirms associated nausea, anorexia x 5 days, vomiting x4 per day, and diarrhea x1 per day. Vomit is described as "white and foamy." Diarrhea is light-colored without blood or mucous. Pt denies any fevers, vaginal loss of fluid, vaginal discharge, headaches, or peripheral swelling.  Location: vaginal Quality: "lighter than a period" Severity: mild Duration: 2 days Context: Pt. Was informed of IUP early this morning after presenting to the ED with same symptoms and having an US done. Was told she could be having signs of early miscarriage and to follow up in 3 days, but pt. States she did not want to wait that long. Timing: intermittent Modifying factors: Bleeding is worse when she coughs Associated signs and symptoms: Lower abdominal cramping, N/V/D.  History reviewed. No pertinent past medical history. OB History  Gravida Para Term Preterm AB SAB TAB Ectopic Multiple Living  2    1  1        # Outcome Date GA Lbr Len/2nd Weight Sex Delivery Anes PTL Lv  2 Current           1 TAB 07/2013             Past Surgical History  Procedure Laterality Date  . Dilation and evacuation     Social History   Social History  . Marital Status: Single    Spouse Name: N/A  . Number of Children: N/A  . Years of Education: N/A   Occupational History  . Not on file.   Social History Main Topics  . Smoking  status: Former Smoker    Quit date: 02/28/2016  . Smokeless tobacco: Never Used  . Alcohol Use: No  . Drug Use: Yes    Special: Marijuana daily (not used in last 3 days due to nausea)  . Sexual Activity: Yes    Birth Control/ Protection: None   Other Topics Concern  . Not on file   Social History Narrative   No current facility-administered medications on file prior to encounter.   No current outpatient prescriptions on file prior to encounter.   No Known Allergies  I have reviewed the past Medical Hx, Surgical Hx, Social Hx, Allergies and Medications.   Review of Systems  Constitutional: Positive for appetite change. Negative for fever, chills and activity change.  HENT: Negative for congestion and sore throat.   Eyes: Negative for discharge and redness.  Respiratory: Positive for cough. Negative for shortness of breath and wheezing.   Cardiovascular: Negative for chest pain, palpitations and leg swelling.  Gastrointestinal: Positive for nausea, vomiting, abdominal pain and diarrhea. Negative for blood in stool and abdominal distention.  Genitourinary: Positive for vaginal bleeding. Negative for vaginal discharge.  Musculoskeletal: Negative for arthralgias.  Skin: Negative for pallor and rash.  Neurological: Negative for dizziness and headaches.    OBJECTIVE Patient Vitals for the past 24 hrs:  BP Temp Temp src Pulse Resp Height Weight  03/01/16 1336 113/79  mmHg 99 F (37.2 C) Oral 89 16 - -  03/01/16 1321 - - - - -  (1.6 m) 111 lb 3.2 oz (50.44 kg)   Constitutional: Well-developed, well-nourished female in no acute distress.  Cardiovascular: normal rate/rhythm. No murmurs or gallops. Respiratory: normal rate and effort. CTAB GI: Abd soft, non-tender, non-distended. Pos BS x 4 MS: Extremities nontender, no edema, normal ROM Neurologic: Alert and oriented x 4.  GU: Neg CVA tenderness  SPECULUM EXAM: NEFG, no physiologic discharge, moderate blood noted, cervix  pink and smooth  LAB RESULTS Results for orders placed or performed during the hospital encounter of 03/01/16 (from the past 24 hour(s))  Urinalysis, Routine w reflex microscopic (not at [facility])     Status: Abnormal   Collection Time: 03/01/16  1:16 PM  Result Value Ref Range   Color, Urine YELLOW YELLOW   APPearance CLEAR CLEAR   Specific Gravity, Urine 1.025 1.005 - 1.030   pH 6.0 5.0 - 8.0   Glucose, UA NEGATIVE NEGATIVE mg/dL   Hgb urine dipstick MODERATE (A) NEGATIVE   Bilirubin Urine SMALL (A) NEGATIVE   Ketones, ur 40 (A) NEGATIVE mg/dL   Protein, ur 30 (A) NEGATIVE mg/dL   Nitrite NEGATIVE NEGATIVE   Leukocytes, UA NEGATIVE NEGATIVE  Urine microscopic-add on     Status: Abnormal   Collection Time: 03/01/16  1:16 PM  Result Value Ref Range   Squamous Epithelial / LPF 0-5 (A) NONE SEEN   WBC, UA 0-5 0 - 5 WBC/hpf   RBC / HPF 0-5 0 - 5 RBC/hpf   Bacteria, UA FEW (A) NONE SEEN    IMAGING US Ob Comp Less 14 Wks  03/01/2016  CLINICAL DATA:  Vaginal bleeding and first-trimester pregnancy. EXAM: OBSTETRIC <14 WK [place] AND TRANSVAGINAL OB US TECHNIQUE: Both transabdominal and transvaginal ultrasound examinations were performed for complete evaluation of the gestation as well as the maternal uterus, adnexal regions, and pelvic cul-de-sac. Transvaginal technique was performed to assess early pregnancy. COMPARISON:  None. FINDINGS: Intrauterine gestational sac: Present Yolk sac:  Present Embryo:  Present Cardiac Activity: Normal cardiac activity not yet seen CRL:  4.6  mm   6 w   1 d                  [place] EDC: 10/24/2016 Subchorionic hemorrhage: 2 cm predominant hyperechoic area with central hypoechoic 8 mm component. Especially given the history this is most likely a mixed echogenicity hematoma. Failed, partially collapsed second gestational sac is an alternate consideration. Maternal uterus/adnexae: Simple 3 cm cyst on the right. IMPRESSION: 1. Intrauterine pregnancy measuring 6 weeks 1 day.  Normal cardiac activity is not yet seen. 2. 2 cm mixed echogenicity structure in the endometrial cavity is likely a hematoma. 3. Recommend surveillance. Electronically Signed   By: Marnee Spring M.D.   On: 03/01/2016 03:51   US Ob Transvaginal  03/01/2016  CLINICAL DATA:  Vaginal bleeding and first-trimester pregnancy. EXAM: OBSTETRIC <14 WK [place] AND TRANSVAGINAL OB US TECHNIQUE: Both transabdominal and transvaginal ultrasound examinations were performed for complete evaluation of the gestation as well as the maternal uterus, adnexal regions, and pelvic cul-de-sac. Transvaginal technique was performed to assess early pregnancy. COMPARISON:  None. FINDINGS: Intrauterine gestational sac: Present Yolk sac:  Present Embryo:  Present Cardiac Activity: Normal cardiac activity not yet seen CRL:  4.6  mm   6 w   1 d  US EDC: 10/24/2016 Subchorionic hemorrhage: 2 cm predominant hyperechoic area with central hypoechoic 8 mm component. Especially given the history this is most likely a mixed echogenicity hematoma. Failed, partially collapsed second gestational sac is an alternate consideration. Maternal uterus/adnexae: Simple 3 cm cyst on the right. IMPRESSION: 1. Intrauterine pregnancy measuring 6 weeks 1 day. Normal cardiac activity is not yet seen. 2. 2 cm mixed echogenicity structure in the endometrial cavity is likely a hematoma. 3. Recommend surveillance. Electronically Signed   By: Marnee SpringJonathon  Watts M.D.   On: 03/01/2016 03:51    MAU COURSE ABO/Rh, wet prep  C/O Nausea. Phenergan given.   MDM Pt. presented to MAU with mild vaginal bleeding unchanged since ED evaluation early this morning. Workup in the ED included US that showed IUP at 4034w1d, UA which had mild Hgb, +beta HCG, negative metabolic panel and negative CBC. Speculum exam today revealed moderate bleeding with no discharge.  N/V of pregnancy, stable  ASSESSMENT Pt. Is a 19y.o. With IUP at 47100w2d dated by US this morning who presented  to MAU with 2 days of mild vaginal bleeding, in stable condition. Suspect threatened miscarriage and recommend follow-up in 1 week for repeat US unless new or worsening symptoms. 1. Vaginal bleeding in pregnancy, first trimester   2. BV (bacterial vaginosis)     PLAN Discharge home in stable condition. SAB Precautions - Return to ED if worsening bleeding or abdominal pain - Return to ED if any fevers - Return if any shortness of breath or dizziness   Follow-up Information    Follow up with COUSINS,SHERONETTE A, MD In 1 week.   Specialty:  Obstetrics and Gynecology   Contact information:   [address] [place] [address] [phone]       [facility] with [facility].   Why:  As needed in emergencies   Contact information:   [address] [id number] mc [place] [id number] [phone]        Medication List    TAKE these medications        CONCEPT OB 130-92.4-1 MG Caps  Take 1 tablet by mouth daily.     metroNIDAZOLE 500 MG tablet  Commonly known as:  FLAGYL  Take 1 tablet (500 mg total) by mouth 2 (two) times daily.     promethazine 25 MG tablet  Commonly known as:  PHENERGAN  Take 1 tablet (25 mg total) by mouth every 6 (six) hours as needed for nausea or vomiting.     promethazine 25 MG suppository  Commonly known as:  PHENERGAN  Place 1 suppository (25 mg total) rectally every 6 (six) hours as needed for nausea or vomiting.       I was present for the exam and agree with above.  [place] Cal Gindlesperger, CNM 03/01/2016  2:57 PM

## 2016-03-01 NOTE — MAU Note (Signed)
Urine sent to lab 

## 2016-03-01 NOTE — ED Notes (Signed)
Pt arrives to the ER via EMS for complaints of feeling tired, intermittent dizziness and not feeling well; pt found out she is pregnant 2 days ago; [facility] and was dc'd; pt refused for [facility] to [facility] Pt placed in a wheelchair and taken to the lobby; Pt sleeping in wheelchair

## 2016-03-01 NOTE — Discharge Instructions (Signed)
Abdominal Pain, Adult Ms. Melissa Kemp, see your regular physician within 3 days for close follow up.  If you have any worsening symptoms or concerns, come back to the ED immediately. Thank you. Many things can cause belly (abdominal) pain. Most times, the belly pain is not dangerous. Many cases of belly pain can be watched and treated at home. HOME CARE   Do not take medicines that help you go poop (laxatives) unless told to by your doctor.  Only take medicine as told by your doctor.  Eat or drink as told by your doctor. Your doctor will tell you if you should be on a special diet. GET HELP IF:  You do not know what is causing your belly pain.  You have belly pain while you are sick to your stomach (nauseous) or have runny poop (diarrhea).  You have pain while you pee or poop.  Your belly pain wakes you up at night.  You have belly pain that gets worse or better when you eat.  You have belly pain that gets worse when you eat fatty foods.  You have a fever. GET HELP RIGHT AWAY IF:   The pain does not go away within 2 hours.  You keep throwing up (vomiting).  The pain changes and is only in the right or left part of the belly.  You have bloody or tarry looking poop. MAKE SURE YOU:   Understand these instructions.  Will watch your condition.  Will get help right away if you are not doing well or get worse.   This information is not intended to replace advice given to you by your health care provider. Make sure you discuss any questions you have with your health care provider.   Document Released: 02/29/2008 Document Revised: 10/03/2014 Document Reviewed: 05/22/2013 Elsevier Interactive Patient Education [employer]2016 [employer]

## 2016-03-02 LAB — GC/CHLAMYDIA PROBE AMP (~~LOC~~) NOT AT ARMC
Chlamydia: NEGATIVE
Neisseria Gonorrhea: NEGATIVE

## 2016-03-02 NOTE — Discharge Instructions (Signed)
Vaginal Bleeding During Pregnancy, First Trimester °A small amount of bleeding (spotting) from the vagina is relatively common in early pregnancy. It usually stops on its own. Various things may cause bleeding or spotting in early pregnancy. Some bleeding may be related to the pregnancy, and some may not. In most cases, the bleeding is normal and is not a problem. However, bleeding can also be a sign of something serious. Be sure to tell your health care provider about any vaginal bleeding right away. °Some possible causes of vaginal bleeding during the first trimester include: °· Infection or inflammation of the cervix. °· Growths (polyps) on the cervix. °· Miscarriage or threatened miscarriage. °· Pregnancy tissue has developed outside of the uterus and in a fallopian tube (tubal pregnancy). °· Tiny cysts have developed in the uterus instead of pregnancy tissue (molar pregnancy). °HOME CARE INSTRUCTIONS  °Watch your condition for any changes. The following actions may help to lessen any discomfort you are feeling: °· Follow your health care provider's instructions for limiting your activity. If your health care provider orders bed rest, you may need to stay in bed and only get up to use the bathroom. However, your health care provider may allow you to continue light activity. °· If needed, make plans for someone to help with your regular activities and responsibilities while you are on bed rest. °· Keep track of the number of pads you use each day, how often you change pads, and how soaked (saturated) they are. Write this down. °· Do not use tampons. Do not douche. °· Do not have sexual intercourse or orgasms until approved by your health care provider. °· If you pass any tissue from your vagina, save the tissue so you can show it to your health care provider. °· Only take over-the-counter or prescription medicines as directed by your health care provider. °· Do not take aspirin because it can make you  bleed. °· Keep all follow-up appointments as directed by your health care provider. °SEEK MEDICAL CARE IF: °· You have any vaginal bleeding during any part of your pregnancy. °· You have cramps or labor pains. °· You have a fever, not controlled by medicine. °SEEK IMMEDIATE MEDICAL CARE IF:  °· You have severe cramps in your back or belly (abdomen). °· You pass large clots or tissue from your vagina. °· Your bleeding increases. °· You feel light-headed or weak, or you have fainting episodes. °· You have chills. °· You are leaking fluid or have a gush of fluid from your vagina. °· You pass out while having a bowel movement. °MAKE SURE YOU: °· Understand these instructions. °· Will watch your condition. °· Will get help right away if you are not doing well or get worse. °  °This information is not intended to replace advice given to you by your health care provider. Make sure you discuss any questions you have with your health care provider. °  °Document Released: 06/22/2005 Document Revised: 09/17/2013 Document Reviewed: 05/20/2013 °Elsevier Interactive Patient Education ©2016 [employer] °Bacterial Vaginosis °Bacterial vaginosis is a vaginal infection that occurs when the normal balance of bacteria in the vagina is disrupted. It results from an overgrowth of certain bacteria. This is the most common vaginal infection in women of childbearing age. Treatment is important to prevent complications, especially in pregnant women, as it can cause a premature delivery. °CAUSES  °Bacterial vaginosis is caused by an increase in harmful bacteria that are normally present in smaller amounts in the vagina. Several   different kinds of bacteria can cause bacterial vaginosis. However, the reason that the condition develops is not fully understood. °RISK FACTORS °Certain activities or behaviors can put you at an increased risk of developing bacterial vaginosis, including: °· Having a new sex partner or multiple sex  partners. °· Douching. °· Using an intrauterine device (IUD) for contraception. °Women do not get bacterial vaginosis from toilet seats, bedding, swimming pools, or contact with objects around them. °SIGNS AND SYMPTOMS  °Some women with bacterial vaginosis have no signs or symptoms. Common symptoms include: °· Grey vaginal discharge. °· A fishlike odor with discharge, especially after sexual intercourse. °· Itching or burning of the vagina and vulva. °· Burning or pain with urination. °DIAGNOSIS  °Your health care provider will take a medical history and examine the vagina for signs of bacterial vaginosis. A sample of vaginal fluid may be taken. Your health care provider will look at this sample under a microscope to check for bacteria and abnormal cells. A vaginal pH test may also be done.  °TREATMENT  °Bacterial vaginosis may be treated with antibiotic medicines. These may be given in the form of a pill or a vaginal cream. A second round of antibiotics may be prescribed if the condition comes back after treatment. Because bacterial vaginosis increases your risk for sexually transmitted diseases, getting treated can help reduce your risk for chlamydia, gonorrhea, HIV, and herpes. °HOME CARE INSTRUCTIONS  °· Only take over-the-counter or prescription medicines as directed by your health care provider. °· If antibiotic medicine was prescribed, take it as directed. Make sure you finish it even if you start to feel better. °· Tell all sexual partners that you have a vaginal infection. They should see their health care provider and be treated if they have problems, such as a mild rash or itching. °· During treatment, it is important that you follow these instructions: °¨ Avoid sexual activity or use condoms correctly. °¨ Do not douche. °¨ Avoid alcohol as directed by your health care provider. °¨ Avoid breastfeeding as directed by your health care provider. °SEEK MEDICAL CARE IF:  °· Your symptoms are not improving  after 3 days of treatment. °· You have increased discharge or pain. °· You have a fever. °MAKE SURE YOU:  °· Understand these instructions. °· Will watch your condition. °· Will get help right away if you are not doing well or get worse. °FOR MORE INFORMATION  °Centers for Disease Control and Prevention, Division of STD Prevention: www.cdc.gov/std °American Sexual Health Association (ASHA): www.ashastd.org  °  °This information is not intended to replace advice given to you by your health care provider. Make sure you discuss any questions you have with your health care provider. °  °Document Released: 09/12/2005 Document Revised: 10/03/2014 Document Reviewed: 04/24/2013 °Elsevier Interactive Patient Education ©2016 [employer] ° °

## 2016-03-08 ENCOUNTER — Inpatient Hospital Stay (HOSPITAL_COMMUNITY): Payer: 59

## 2016-03-08 ENCOUNTER — Encounter (HOSPITAL_COMMUNITY): Payer: Self-pay | Admitting: *Deleted

## 2016-03-08 ENCOUNTER — Inpatient Hospital Stay (HOSPITAL_COMMUNITY)
Admission: AD | Admit: 2016-03-08 | Discharge: 2016-03-08 | Disposition: A | Discharge status: Home / Self Care | Payer: 59 | Source: Ambulatory Visit | Attending: Obstetrics & Gynecology | Admitting: Obstetrics & Gynecology

## 2016-03-08 DIAGNOSIS — Z3A01 Less than 8 weeks gestation of pregnancy: Secondary | ICD-10-CM | POA: Insufficient documentation

## 2016-03-08 DIAGNOSIS — O4691 Antepartum hemorrhage, unspecified, first trimester: Secondary | ICD-10-CM

## 2016-03-08 DIAGNOSIS — O211 Hyperemesis gravidarum with metabolic disturbance: Secondary | ICD-10-CM | POA: Insufficient documentation

## 2016-03-08 DIAGNOSIS — N760 Acute vaginitis: Secondary | ICD-10-CM

## 2016-03-08 DIAGNOSIS — O219 Vomiting of pregnancy, unspecified: Secondary | ICD-10-CM

## 2016-03-08 DIAGNOSIS — E860 Dehydration: Secondary | ICD-10-CM

## 2016-03-08 DIAGNOSIS — O23591 Infection of other part of genital tract in pregnancy, first trimester: Secondary | ICD-10-CM | POA: Diagnosis not present

## 2016-03-08 DIAGNOSIS — O209 Hemorrhage in early pregnancy, unspecified: Secondary | ICD-10-CM | POA: Insufficient documentation

## 2016-03-08 DIAGNOSIS — B9689 Other specified bacterial agents as the cause of diseases classified elsewhere: Secondary | ICD-10-CM

## 2016-03-08 DIAGNOSIS — Z87891 Personal history of nicotine dependence: Secondary | ICD-10-CM | POA: Insufficient documentation

## 2016-03-08 LAB — URINE MICROSCOPIC-ADD ON: RBC / HPF: NONE SEEN RBC/hpf (ref 0–5)

## 2016-03-08 LAB — URINALYSIS, ROUTINE W REFLEX MICROSCOPIC
GLUCOSE, UA: NEGATIVE mg/dL
Ketones, ur: >80 mg/dL — AB
Leukocytes, UA: NEGATIVE
Nitrite: NEGATIVE
Protein, ur: NEGATIVE mg/dL
SPECIFIC GRAVITY, URINE: 1.025 (ref 1.005–1.030)
pH: 6 (ref 5.0–8.0)

## 2016-03-08 MED ORDER — DOXYLAMINE-PYRIDOXINE 10-10 MG PO TBEC: 1.0000 | DELAYED_RELEASE_TABLET | Freq: Two times a day (BID) | ORAL | Status: DC

## 2016-03-08 NOTE — MAU Note (Signed)
Was seen in the emergency room and here, possible miscarriage. Passed out the same day she was seen here- was taken to the emergency.  Says she was told to come back in a week for US. Still having a little nausea, threw up twice yesterday, none today- doesn't really have an appetite. If she stands too long she gets dizzy.

## 2016-03-08 NOTE — MAU Provider Note (Signed)
History     CSN: [id number]  Arrival date and time: 03/08/16 1547   First Provider Initiated Contact with Patient 03/08/16 1753      No chief complaint on file.  HPIpt is 10974w1d G2P0010 who presents for f/u after being seen on 03/01/2016 with bleeding in pregnancy.  Pt has had light spotting since her visit.  Pt's ultrasound showed 7736w1d vi IUP but  cardiac activity not seen.  There was a YS and embryo. A 2 cm mixed echogenicity structure in the endometrial cavity  Was likely a hematoma.  Failed partially collapsed second gestational sac also a consideration. Pt was instructed to return to MAU to re-evaluate in one week. Pt admits to being depressed and no appetite.  She states she is not suicidal.  Pt states she has family issues.  Pt has not been in counseling.   RN note: Nurse)     Expand All Collapse All   Was seen in the emergency room and here, possible miscarriage. Passed out the same day she was seen here- was taken to the emergency. Says she was told to come back in a week for US. Still having a little nausea, threw up twice yesterday, none today- doesn't really have an appetite. If she stands too long she gets dizzy.      History reviewed. No pertinent past medical history.  Past Surgical History  Procedure Laterality Date  . Dilation and evacuation      Family History  Problem Relation Age of Onset  . Hypertension Mother   . Hypertension Father     Social History  Substance Use Topics  . Smoking status: Former Smoker    Quit date: 02/28/2016  . Smokeless tobacco: Never Used  . Alcohol Use: No    Allergies: No Known Allergies  Prescriptions prior to admission  Medication Sig Dispense Refill Last Dose  . metroNIDAZOLE (FLAGYL) 500 MG tablet Take 1 tablet (500 mg total) by mouth 2 (two) times daily. 14 tablet 0 03/08/2016 at Unknown time  . Prenat w/o A Vit-FeFum-FePo-FA (CONCEPT OB) 130-92.4-1 MG CAPS Take 1 tablet by mouth daily. 30 capsule 12 03/08/2016 at  Unknown time  . promethazine (PHENERGAN) 25 MG suppository Place 1 suppository (25 mg total) rectally every 6 (six) hours as needed for nausea or vomiting. 12 each 1 03/08/2016 at Unknown time  . promethazine (PHENERGAN) 25 MG tablet Take 1 tablet (25 mg total) by mouth every 6 (six) hours as needed for nausea or vomiting. 30 tablet 1 03/08/2016 at Unknown time    Review of Systems  Constitutional: Positive for weight loss. Negative for fever and chills.  Respiratory: Negative for cough.   Gastrointestinal: Positive for nausea. Negative for vomiting and abdominal pain.  Genitourinary: Negative for dysuria.  Neurological: Positive for dizziness. Negative for headaches.  Psychiatric/Behavioral: Positive for depression. Negative for suicidal ideas.   Physical Exam   Blood pressure 119/67, pulse 72, temperature 98.8 F (37.1 C), resp. rate 16, weight 107 lb 9.6 oz (48.807 kg), last menstrual period 02/28/2016.  Physical Exam  Nursing note and vitals reviewed. Constitutional: She is oriented to person, place, and time. She appears well-developed and well-nourished. No distress.  HENT:  Head: Normocephalic.  Eyes: Pupils are equal, round, and reactive to light.  Neck: Normal range of motion. Neck supple.  Cardiovascular: Normal rate.   Respiratory: Effort normal.  GI: Soft. She exhibits no distension. There is no tenderness. There is no rebound.  Musculoskeletal: Normal range of motion.  Neurological: She is alert and oriented to person, place, and time.  Skin: Skin is warm and dry.  Psychiatric: She has a normal mood and affect.    MAU Course  Procedures Results for orders placed or performed during the hospital encounter of 03/08/16 (from the past 24 hour(s))  Urinalysis, Routine w reflex microscopic (not at [facility])     Status: Abnormal   Collection Time: 03/08/16  5:30 PM  Result Value Ref Range   Color, Urine AMBER (A) YELLOW   APPearance CLEAR CLEAR   Specific Gravity, Urine  1.025 1.005 - 1.030   pH 6.0 5.0 - 8.0   Glucose, UA NEGATIVE NEGATIVE mg/dL   Hgb urine dipstick TRACE (A) NEGATIVE   Bilirubin Urine SMALL (A) NEGATIVE   Ketones, ur >80 (A) NEGATIVE mg/dL   Protein, ur NEGATIVE NEGATIVE mg/dL   Nitrite NEGATIVE NEGATIVE   Leukocytes, UA NEGATIVE NEGATIVE  Urine microscopic-add on     Status: Abnormal   Collection Time: 03/08/16  5:30 PM  Result Value Ref Range   Squamous Epithelial / LPF 0-5 (A) NONE SEEN   WBC, UA 0-5 0 - 5 WBC/hpf   RBC / HPF NONE SEEN 0 - 5 RBC/hpf   Bacteria, UA MANY (A) NONE SEEN   Urine-Other MUCOUS PRESENT    Care turned over to Melissa Kemp, CNM Assessment and Plan    Kemp,Melissa 03/08/2016, 7:15 PM   Assumed care at 1930. Addendum:  Has not vomited today but poor appetite and has had only milk. Declines IV rehydration. Unaware Phenergan was prescribed. Taking Flagyl for BV. Prelim [place]: Viable IUP with HR 132. Mod SCH. Simple cyst rt ovary approx. 2x3 cm   ASSESSMENT: 20 yo G2P0010 at [redacted]w[redacted]d with viable IUP Vaginal bleeding in pregnancy, first trimester - Plan: US OB Transvaginal, US OB Transvaginal, Discharge patient  Dehydration, moderate - Plan: Discharge patient  Nausea and vomiting during pregnancy prior to [redacted] weeks gestation - Plan: Discharge patient  BV (bacterial vaginosis)  PLAN: Discharge home. Will try antiemetics and eat small frequent meals   Medication List    TAKE these medications        CONCEPT OB 130-92.4-1 MG Caps  Take 1 tablet by mouth daily.     Doxylamine-Pyridoxine 10-10 MG Tbec  Commonly known as:  DICLEGIS  Take 1 tablet by mouth 2 (two) times daily.     metroNIDAZOLE 500 MG tablet  Commonly known as:  FLAGYL  Take 1 tablet (500 mg total) by mouth 2 (two) times daily.     promethazine 25 MG tablet  Commonly known as:  PHENERGAN  Take 1 tablet (25 mg total) by mouth every 6 (six) hours as needed for nausea or vomiting.     promethazine 25 MG suppository  Commonly known  as:  PHENERGAN  Place 1 suppository (25 mg total) rectally every 6 (six) hours as needed for nausea or vomiting.        Follow-up Information    Follow up with COUSINS,SHERONETTE A, MD. Schedule an appointment as soon as possible for a visit in 1 week.   Specialty:  Obstetrics and Gynecology   Contact information:   [address] [place] [address] [phone]

## 2016-03-08 NOTE — Discharge Instructions (Signed)
Dehydration, Adult °Dehydration is a condition in which you do not have enough fluid or water in your body. It happens when you take in less fluid than you lose. Vital organs such as the kidneys, brain, and heart cannot function without a proper amount of fluids. Any loss of fluids from the body can cause dehydration.  °Dehydration can range from mild to severe. This condition should be treated right away to help prevent it from becoming severe. °CAUSES  °This condition may be caused by: °· Vomiting. °· Diarrhea. °· Excessive sweating, such as when exercising in hot or humid weather. °· Not drinking enough fluid during strenuous exercise or during an illness. °· Excessive urine output. °· Fever. °· Certain medicines. °RISK FACTORS °This condition is more likely to develop in: °· People who are taking certain medicines that cause the body to lose excess fluid (diuretics).   °· People who have a chronic illness, such as diabetes, that may increase urination. °· Older adults.   °· People who live at high altitudes.   °· People who participate in endurance sports.   °SYMPTOMS  °Mild Dehydration °· Thirst. °· Dry lips. °· Slightly dry mouth. °· Dry, warm skin. °Moderate Dehydration °· Very dry mouth.   °· Muscle cramps.   °· Dark urine and decreased urine production.   °· Decreased tear production.   °· Headache.   °· Light-headedness, especially when you stand up from a sitting position.   °Severe Dehydration °· Changes in skin.   °¨ Cold and clammy skin.   °¨ Skin does not spring back quickly when lightly pinched and released.   °· Changes in body fluids.   °¨ Extreme thirst.   °¨ No tears.   °¨ Not able to sweat when body temperature is high, such as in hot weather.   °¨ Minimal urine production.   °· Changes in vital signs.   °¨ Rapid, weak pulse (more than 100 beats per minute when you are sitting still).   °¨ Rapid breathing.   °¨ Low blood pressure.   °· Other changes.   °¨ Sunken eyes.   °¨ Cold hands and feet.    °¨ Confusion. °¨ Lethargy and difficulty being awakened. °¨ Fainting (syncope).   °¨ Short-term weight loss.   °¨ Unconsciousness. °DIAGNOSIS  °This condition may be diagnosed based on your symptoms. You may also have tests to determine how severe your dehydration is. These tests may include:  °· Urine tests.   °· Blood tests.   °TREATMENT  °Treatment for this condition depends on the severity. Mild or moderate dehydration can often be treated at home. Treatment should be started right away. Do not wait until dehydration becomes severe. Severe dehydration needs to be treated at the hospital. °Treatment for Mild Dehydration °· Drinking plenty of water to replace the fluid you have lost.   °· Replacing minerals in your blood (electrolytes) that you may have lost.   °Treatment for Moderate Dehydration  °· Consuming oral rehydration solution (ORS). °Treatment for Severe Dehydration °· Receiving fluid through an IV tube.   °· Receiving electrolyte solution through a feeding tube that is passed through your nose and into your stomach (nasogastric tube or NG tube). °· Correcting any abnormalities in electrolytes. °HOME CARE INSTRUCTIONS  °· Drink enough fluid to keep your urine clear or pale yellow.   °· Drink water or fluid slowly by taking small sips. You can also try sucking on ice cubes.  °· Have food or beverages that contain electrolytes. Examples include bananas and sports drinks. °· Take over-the-counter and prescription medicines only as told by your health care provider.   °· Prepare ORS according to the manufacturer's instructions. Take sips   of ORS every 5 minutes until your urine returns to normal.  If you have vomiting or diarrhea, continue to try to drink water, ORS, or both.   If you have diarrhea, avoid:   Beverages that contain caffeine.   Fruit juice.   Milk.   Carbonated soft drinks.  Do not take salt tablets. This can lead to the condition of having too much sodium in your body  (hypernatremia).  SEEK MEDICAL CARE IF:  You cannot eat or drink without vomiting.  You have had moderate diarrhea during a period of more than 24 hours.  You have a fever. SEEK IMMEDIATE MEDICAL CARE IF:   You have extreme thirst.  You have severe diarrhea.  You have not urinated in 6-8 hours, or you have urinated only a small amount of very dark urine.  You have shriveled skin.  You are dizzy, confused, or both.   This information is not intended to replace advice given to you by your health care provider. Make sure you discuss any questions you have with your health care provider.   Document Released: 09/12/2005 Document Revised: 06/03/2015 Document Reviewed: 01/28/2015 Elsevier Interactive Patient Education 2016 [employer]. First Trimester of Pregnancy The first trimester of pregnancy is from week 1 until the end of week 12 (months 1 through 3). A week after a sperm fertilizes an egg, the egg will implant on the wall of the uterus. This embryo will begin to develop into a baby. Genes from you and your partner are forming the baby. The female genes determine whether the baby is a boy or a girl. At 6-8 weeks, the eyes and face are formed, and the heartbeat can be seen on ultrasound. At the end of 12 weeks, all the baby's organs are formed.  Now that you are pregnant, you will want to do everything you can to have a healthy baby. Two of the most important things are to get good prenatal care and to follow your health care provider's instructions. Prenatal care is all the medical care you receive before the baby's birth. This care will help prevent, find, and treat any problems during the pregnancy and childbirth. BODY CHANGES Your body goes through many changes during pregnancy. The changes vary from woman to woman.   You may gain or lose a couple of pounds at first.  You may feel sick to your stomach (nauseous) and throw up (vomit). If the vomiting is uncontrollable, call your  health care provider.  You may tire easily.  You may develop headaches that can be relieved by medicines approved by your health care provider.  You may urinate more often. Painful urination may mean you have a bladder infection.  You may develop heartburn as a result of your pregnancy.  You may develop constipation because certain hormones are causing the muscles that push waste through your intestines to slow down.  You may develop hemorrhoids or swollen, bulging veins (varicose veins).  Your breasts may begin to grow larger and become tender. Your nipples may stick out more, and the tissue that surrounds them (areola) may become darker.  Your gums may bleed and may be sensitive to brushing and flossing.  Dark spots or blotches (chloasma, mask of pregnancy) may develop on your face. This will likely fade after the baby is born.  Your menstrual periods will stop.  You may have a loss of appetite.  You may develop cravings for certain kinds of food.  You may have changes in your  emotions from day to day, such as being excited to be pregnant or being concerned that something may go wrong with the pregnancy and baby.  You may have more vivid and strange dreams.  You may have changes in your hair. These can include thickening of your hair, rapid growth, and changes in texture. Some women also have hair loss during or after pregnancy, or hair that feels dry or thin. Your hair will most likely return to normal after your baby is born. WHAT TO EXPECT AT YOUR PRENATAL VISITS During a routine prenatal visit:  You will be weighed to make sure you and the baby are growing normally.  Your blood pressure will be taken.  Your abdomen will be measured to track your baby's growth.  The fetal heartbeat will be listened to starting around week 10 or 12 of your pregnancy.  Test results from any previous visits will be discussed. Your health care provider may ask you:  How you are  feeling.  If you are feeling the baby move.  If you have had any abnormal symptoms, such as leaking fluid, bleeding, severe headaches, or abdominal cramping.  If you are using any tobacco products, including cigarettes, chewing tobacco, and electronic cigarettes.  If you have any questions. Other tests that may be performed during your first trimester include:  Blood tests to find your blood type and to check for the presence of any previous infections. They will also be used to check for low iron levels (anemia) and Rh antibodies. Later in the pregnancy, blood tests for diabetes will be done along with other tests if problems develop.  Urine tests to check for infections, diabetes, or protein in the urine.  An ultrasound to confirm the proper growth and development of the baby.  An amniocentesis to check for possible genetic problems.  Fetal screens for spina bifida and Down syndrome.  You may need other tests to make sure you and the baby are doing well.  HIV (human immunodeficiency virus) testing. Routine prenatal testing includes screening for HIV, unless you choose not to have this test. HOME CARE INSTRUCTIONS  Medicines  Follow your health care provider's instructions regarding medicine use. Specific medicines may be either safe or unsafe to take during pregnancy.  Take your prenatal vitamins as directed.  If you develop constipation, try taking a stool softener if your health care provider approves. Diet  Eat regular, well-balanced meals. Choose a variety of foods, such as meat or vegetable-based protein, fish, milk and low-fat dairy products, vegetables, fruits, and whole grain breads and cereals. Your health care provider will help you determine the amount of weight gain that is right for you.  Avoid raw meat and uncooked cheese. These carry germs that can cause birth defects in the baby.  Eating four or five small meals rather than three large meals a day may help  relieve nausea and vomiting. If you start to feel nauseous, eating a few soda crackers can be helpful. Drinking liquids between meals instead of during meals also seems to help nausea and vomiting.  If you develop constipation, eat more high-fiber foods, such as fresh vegetables or fruit and whole grains. Drink enough fluids to keep your urine clear or pale yellow. Activity and Exercise  Exercise only as directed by your health care provider. Exercising will help you:  Control your weight.  Stay in shape.  Be prepared for labor and delivery.  Experiencing pain or cramping in the lower abdomen or low back  is a good sign that you should stop exercising. Check with your health care provider before continuing normal exercises.  Try to avoid standing for long periods of time. Move your legs often if you must stand in one place for a long time.  Avoid heavy lifting.  Wear low-heeled shoes, and practice good posture.  You may continue to have sex unless your health care provider directs you otherwise. Relief of Pain or Discomfort  Wear a good support bra for breast tenderness.   Take warm sitz baths to soothe any pain or discomfort caused by hemorrhoids. Use hemorrhoid cream if your health care provider approves.   Rest with your legs elevated if you have leg cramps or low back pain.  If you develop varicose veins in your legs, wear support hose. Elevate your feet for 15 minutes, 3-4 times a day. Limit salt in your diet. Prenatal Care  Schedule your prenatal visits by the twelfth week of pregnancy. They are usually scheduled monthly at first, then more often in the last 2 months before delivery.  Write down your questions. Take them to your prenatal visits.  Keep all your prenatal visits as directed by your health care provider. Safety  Wear your seat belt at all times when driving.  Make a list of emergency phone numbers, including numbers for family, friends, the hospital, and  police and fire departments. General Tips  Ask your health care provider for a referral to a local prenatal education class. Begin classes no later than at the beginning of month 6 of your pregnancy.  Ask for help if you have counseling or nutritional needs during pregnancy. Your health care provider can offer advice or refer you to specialists for help with various needs.  Do not use hot tubs, steam rooms, or saunas.  Do not douche or use tampons or scented sanitary pads.  Do not cross your legs for long periods of time.  Avoid cat litter boxes and soil used by cats. These carry germs that can cause birth defects in the baby and possibly loss of the fetus by miscarriage or stillbirth.  Avoid all smoking, herbs, alcohol, and medicines not prescribed by your health care provider. Chemicals in these affect the formation and growth of the baby.  Do not use any tobacco products, including cigarettes, chewing tobacco, and electronic cigarettes. If you need help quitting, ask your health care provider. You may receive counseling support and other resources to help you quit.  Schedule a dentist appointment. At home, brush your teeth with a soft toothbrush and be gentle when you floss. SEEK MEDICAL CARE IF:   You have dizziness.  You have mild pelvic cramps, pelvic pressure, or nagging pain in the abdominal area.  You have persistent nausea, vomiting, or diarrhea.  You have a bad smelling vaginal discharge.  You have pain with urination.  You notice increased swelling in your face, hands, legs, or ankles. SEEK IMMEDIATE MEDICAL CARE IF:   You have a fever.  You are leaking fluid from your vagina.  You have spotting or bleeding from your vagina.  You have severe abdominal cramping or pain.  You have rapid weight gain or loss.  You vomit blood or material that looks like coffee grounds.  You are exposed to [place] measles and have never had them.  You are exposed to fifth  disease or chickenpox.  You develop a severe headache.  You have shortness of breath.  You have any kind of trauma, such as  from a fall or a car accident.   This information is not intended to replace advice given to you by your health care provider. Make sure you discuss any questions you have with your health care provider.   Document Released: 09/06/2001 Document Revised: 10/03/2014 Document Reviewed: 07/23/2013 Elsevier Interactive Patient Education 2016 Elsevier Inc[employer] Morning Sickness Morning sickness is when you feel sick to your stomach (nauseous) during pregnancy. This nauseous feeling may or may not come with vomiting. It often occurs in the morning but can be a problem any time of day. Morning sickness is most common during the first trimester, but it may continue throughout pregnancy. While morning sickness is unpleasant, it is usually harmless unless you develop severe and continual vomiting (hyperemesis gravidarum). This condition requires more intense treatment.  CAUSES  The cause of morning sickness is not completely known but seems to be related to normal hormonal changes that occur in pregnancy. RISK FACTORS You are at greater risk if you:  Experienced nausea or vomiting before your pregnancy.  Had morning sickness during a previous pregnancy.  Are pregnant with more than one baby, such as twins. TREATMENT  Do not use any medicines (prescription, over-the-counter, or herbal) for morning sickness without first talking to your health care provider. Your health care provider may prescribe or recommend:  Vitamin B6 supplements.  Anti-nausea medicines.  The herbal medicine ginger. HOME CARE INSTRUCTIONS   Only take over-the-counter or prescription medicines as directed by your health care provider.  Taking multivitamins before getting pregnant can prevent or decrease the severity of morning sickness in most women.  Eat a piece of dry toast or unsalted crackers before  getting out of bed in the morning.  Eat five or six small meals a day.  Eat dry and bland foods (rice, baked potato). Foods high in carbohydrates are often helpful.  Do not drink liquids with your meals. Drink liquids between meals.  Avoid greasy, fatty, and spicy foods.  Get someone to cook for you if the smell of any food causes nausea and vomiting.  If you feel nauseous after taking prenatal vitamins, take the vitamins at night or with a snack.  Snack on protein foods (nuts, yogurt, cheese) between meals if you are hungry.  Eat unsweetened gelatins for desserts.  Wearing an acupressure wristband (worn for sea sickness) may be helpful.  Acupuncture may be helpful.  Do not smoke.  Get a humidifier to keep the air in your house free of odors.  Get plenty of fresh air. SEEK MEDICAL CARE IF:   Your home remedies are not working, and you need medicine.  You feel dizzy or lightheaded.  You are losing weight. SEEK IMMEDIATE MEDICAL CARE IF:   You have persistent and uncontrolled nausea and vomiting.  You pass out (faint). MAKE SURE YOU:  Understand these instructions.  Will watch your condition.  Will get help right away if you are not doing well or get worse.   This information is not intended to replace advice given to you by your health care provider. Make sure you discuss any questions you have with your health care provider.   Document Released: 11/03/2006 Document Revised: 09/17/2013 Document Reviewed: 02/27/2013 Elsevier Interactive Patient Education [employer]

## 2016-08-02 ENCOUNTER — Encounter (HOSPITAL_COMMUNITY): Payer: Self-pay | Admitting: *Deleted

## 2016-08-02 ENCOUNTER — Emergency Department (HOSPITAL_COMMUNITY)
Admission: EM | Admit: 2016-08-02 | Discharge: 2016-08-02 | Disposition: A | Discharge status: Home / Self Care | Payer: 59 | Attending: Emergency Medicine | Admitting: Emergency Medicine

## 2016-08-02 DIAGNOSIS — Z79899 Other long term (current) drug therapy: Secondary | ICD-10-CM | POA: Diagnosis not present

## 2016-08-02 DIAGNOSIS — F17200 Nicotine dependence, unspecified, uncomplicated: Secondary | ICD-10-CM | POA: Insufficient documentation

## 2016-08-02 DIAGNOSIS — R000 Tachycardia, unspecified: Secondary | ICD-10-CM | POA: Diagnosis present

## 2016-08-02 LAB — URINALYSIS, ROUTINE W REFLEX MICROSCOPIC
Bilirubin Urine: NEGATIVE
GLUCOSE, UA: NEGATIVE mg/dL
Hgb urine dipstick: NEGATIVE
KETONES UR: NEGATIVE mg/dL
Nitrite: NEGATIVE
PH: 6 (ref 5.0–8.0)
Protein, ur: NEGATIVE mg/dL
Specific Gravity, Urine: 1.026 (ref 1.005–1.030)

## 2016-08-02 LAB — COMPREHENSIVE METABOLIC PANEL
ALBUMIN: 4.5 g/dL (ref 3.5–5.0)
ALT: 7 U/L — ABNORMAL LOW (ref 14–54)
ANION GAP: 6 (ref 5–15)
AST: 18 U/L (ref 15–41)
Alkaline Phosphatase: 60 U/L (ref 38–126)
BUN: 14 mg/dL (ref 6–20)
CHLORIDE: 105 mmol/L (ref 101–111)
CO2: 27 mmol/L (ref 22–32)
Calcium: 9.4 mg/dL (ref 8.9–10.3)
Creatinine, Ser: 0.91 mg/dL (ref 0.44–1.00)
GFR calc Af Amer: >60 mL/min (ref >60)
GFR calc non Af Amer: >60 mL/min (ref >60)
GLUCOSE: 101 mg/dL — AB (ref 65–99)
POTASSIUM: 4.1 mmol/L (ref 3.5–5.1)
SODIUM: 138 mmol/L (ref 135–145)
TOTAL PROTEIN: 7.2 g/dL (ref 6.5–8.1)
Total Bilirubin: 0.6 mg/dL (ref 0.3–1.2)

## 2016-08-02 LAB — CBC WITH DIFFERENTIAL/PLATELET
BASOS ABS: 0 10*3/uL (ref 0.0–0.1)
Basophils Relative: 0 %
Eosinophils Absolute: 0.1 10*3/uL (ref 0.0–0.7)
Eosinophils Relative: 1 %
HEMATOCRIT: 44.9 % (ref 36.0–46.0)
Hemoglobin: 15.2 g/dL — ABNORMAL HIGH (ref 12.0–15.0)
LYMPHS ABS: 1.9 10*3/uL (ref 0.7–4.0)
LYMPHS PCT: 19 %
MCH: 30.8 pg (ref 26.0–34.0)
MCHC: 33.9 g/dL (ref 30.0–36.0)
MCV: 91.1 fL (ref 78.0–100.0)
MONO ABS: 0.5 10*3/uL (ref 0.1–1.0)
MONOS PCT: 5 %
NEUTROS ABS: 7.4 10*3/uL (ref 1.7–7.7)
Neutrophils Relative %: 75 %
Platelets: 236 10*3/uL (ref 150–400)
RBC: 4.93 MIL/uL (ref 3.87–5.11)
RDW: 12.6 % (ref 11.5–15.5)
WBC: 9.9 10*3/uL (ref 4.0–10.5)

## 2016-08-02 LAB — RAPID URINE DRUG SCREEN, HOSP PERFORMED
Amphetamines: NOT DETECTED
BARBITURATES: NOT DETECTED
BENZODIAZEPINES: NOT DETECTED
COCAINE: NOT DETECTED
Opiates: NOT DETECTED
TETRAHYDROCANNABINOL: POSITIVE — AB

## 2016-08-02 LAB — D-DIMER, QUANTITATIVE: D-Dimer, Quant: 0.46 ug/mL-FEU (ref 0.00–0.50)

## 2016-08-02 LAB — URINE MICROSCOPIC-ADD ON: RBC / HPF: NONE SEEN RBC/hpf (ref 0–5)

## 2016-08-02 LAB — TSH: TSH: 0.834 u[IU]/mL (ref 0.350–4.500)

## 2016-08-02 LAB — PREGNANCY, URINE: Preg Test, Ur: NEGATIVE

## 2016-08-02 MED ORDER — SODIUM CHLORIDE 0.9 % IV BOLUS (SEPSIS)
1000.0000 mL | Freq: Once | INTRAVENOUS | Status: AC
  Administered 2016-08-02: 1000 mL via INTRAVENOUS

## 2016-08-02 NOTE — Discharge Instructions (Signed)
Tests showed no life-threatening condition. Recommend follow-up your primary care doctor and possibly a cardiologist for the syncope [passing out spells] and rapid heart rate

## 2016-08-02 NOTE — ED Provider Notes (Signed)
[facility]-EMERGENCY DEPT Provider Note   CSN: [id number] Arrival date & time: 08/02/16  1748     History   Chief Complaint Chief Complaint  Patient presents with  . Tachycardia    HPI Melissa Kemp is a 20 y.o. female.  Patient states 3 syncopal spells over the last 6-8 months. These are not preceded by ringing in the ears and blurred vision.Marland Kitchen. She was told she has a "murmur", but this has not been further evaluated. Additionally patient has claimed a rapid heart rate. No known past medical history. No medications. She smokes marijuana, but no street other street drugs. No alcohol. No substernal chest pain, dyspnea, vaginal bleeding, vaginal discharge, fever, sweats, chills      History reviewed. No pertinent past medical history.  There are no active problems to display for this patient.   Past Surgical History:  Procedure Laterality Date  . DILATION AND EVACUATION      OB History    Gravida Para Term Preterm AB Living   2       1     SAB TAB Ectopic Multiple Live Births     1             Home Medications    Prior to Admission medications   Medication Sig Start Date End Date Taking? Authorizing Provider  Norethindrone-Ethinyl Estradiol-Fe Biphas (LO LOESTRIN FE) 1 MG-10 MCG / 10 MCG tablet Take 1 tablet by mouth daily.   Yes Historical Provider, MD    Family History Family History  Problem Relation Age of Onset  . Hypertension Mother   . Hypertension Father     Social History Social History  Substance Use Topics  . Smoking status: Current Some Day Smoker    Last attempt to quit: 02/28/2016  . Smokeless tobacco: Never Used  . Alcohol use No     Allergies   Patient has no known allergies.   Review of Systems Review of Systems  All other systems reviewed and are negative.    Physical Exam Updated Vital Signs BP 97/71 (BP Location: Left Arm)   Pulse 90   Temp 98.7 F (37.1 C) (Oral)   Resp 23   Ht 5\' 3"  (1.6 m)   Wt 109 lb 1 oz (49.5 kg)   LMP  02/28/2016 (LMP Unknown)   SpO2 100%   BMI 19.32 kg/m   Physical Exam  Constitutional: She is oriented to person, place, and time. She appears well-developed and well-nourished.  HENT:  Head: Normocephalic and atraumatic.  Eyes: Conjunctivae are normal.  Neck: Neck supple.  Cardiovascular: Regular rhythm.   Initially, patient was tachycardic;  questionable systolic ejection murmur  Pulmonary/Chest: Effort normal and breath sounds normal.  Abdominal: Soft. Bowel sounds are normal.  Musculoskeletal: Normal range of motion.  Neurological: She is alert and oriented to person, place, and time.  Skin: Skin is warm and dry.  Psychiatric: She has a normal mood and affect. Her behavior is normal.  Nursing note and vitals reviewed.    ED Treatments / Results  Labs (all labs ordered are listed, but only abnormal results are displayed) Labs Reviewed  COMPREHENSIVE METABOLIC PANEL - Abnormal; Notable for the following:       Result Value   Glucose, Bld 101 (*)    ALT 7 (*)    All other components within normal limits  CBC WITH DIFFERENTIAL/PLATELET - Abnormal; Notable for the following:    Hemoglobin 15.2 (*)    All other components within normal  limits  URINALYSIS, ROUTINE W REFLEX MICROSCOPIC (NOT AT [facility]) - Abnormal; Notable for the following:    APPearance CLOUDY (*)    Leukocytes, UA TRACE (*)    All other components within normal limits  RAPID URINE DRUG SCREEN, HOSP PERFORMED - Abnormal; Notable for the following:    Tetrahydrocannabinol POSITIVE (*)    All other components within normal limits  URINE MICROSCOPIC-ADD ON - Abnormal; Notable for the following:    Squamous Epithelial / LPF TOO NUMEROUS TO COUNT (*)    Bacteria, UA MANY (*)    All other components within normal limits  PREGNANCY, URINE  TSH  D-DIMER, QUANTITATIVE (NOT AT [facility])    EKG  EKG Interpretation  Date/Time:  Tuesday August 02 2016 18:00:46 EST Ventricular Rate:  127 PR Interval:    QRS  Duration: 90 QT Interval:  287 QTC Calculation: 418 R Axis:   88 Text Interpretation:  Sinus tachycardia Consider right atrial enlargement Nonspecific T abnormalities, anterior leads Confirmed by Adriana SimasOOK  MD, [place] Pettengill ([id number]) on 08/02/2016 7:27:03 PM       Radiology No results found.  Procedures Procedures (including critical care time)  Medications Ordered in ED Medications  sodium chloride 0.9 % bolus 1,000 mL (0 mLs Intravenous Stopped 08/02/16 2234)  sodium chloride 0.9 % bolus 1,000 mL (1,000 mLs Intravenous New Bag/Given 08/02/16 2028)     Initial Impression / Assessment and Plan / ED Course  I have reviewed the triage vital signs and the nursing notes.  Pertinent labs & imaging results that were available during my care of the patient were reviewed by me and considered in my medical decision making (see chart for details).  Clinical Course     Patient appears nontoxic. She was initially tachycardic, but this has improved with hydration. Screening tests including CBC, chemistry panel, TSH, d-dimer, pregnancy test all negative. Findings were discussed with the patient and her mother. Recommend cardiology follow-up.  Final Clinical Impressions(s) / ED Diagnoses   Final diagnoses:  Tachycardia    New Prescriptions New Prescriptions   No medications on file     Donnetta HutchingBrian Soren Lazarz, MD 08/02/16 2248

## 2016-08-02 NOTE — ED Triage Notes (Signed)
Patient c/o intermittent dizziness, SOB and weakness x6-8 months.  Patient had a syncopal episode at work in July and was not evaluated for that.  Patient denies chest pain currently or in last few weeks.  Patient denies N/V and fever.  Patient has some concerns that she might be pregnant - she has had unprotected sex recently.  She DOES NOT want to discuss this in front of her mother, who is with her.  Patient admits to occasion use of marijuana, but denies other street drugs, specifically, cocaine, meth and heroin.  Patient is tachycardic in triage with a HR of 120's - 130's.

## 2017-01-04 ENCOUNTER — Encounter (HOSPITAL_COMMUNITY): Payer: Self-pay

## 2017-07-15 ENCOUNTER — Inpatient Hospital Stay (HOSPITAL_COMMUNITY)
Admission: AD | Admit: 2017-07-15 | Discharge: 2017-07-15 | Disposition: A | Discharge status: Home / Self Care | Payer: 59 | Source: Ambulatory Visit | Attending: Obstetrics and Gynecology | Admitting: Obstetrics and Gynecology

## 2017-07-15 ENCOUNTER — Encounter (HOSPITAL_COMMUNITY): Payer: Self-pay

## 2017-07-15 DIAGNOSIS — N912 Amenorrhea, unspecified: Secondary | ICD-10-CM | POA: Insufficient documentation

## 2017-07-15 NOTE — MAU Note (Signed)
Patient presents for pregnancy test, LMP 06/02/17

## 2017-07-15 NOTE — MAU Note (Signed)
Dr. Cherly Hensenousins into see patient, patient will follow up with Dr. Purnell Shoemakerousin's office on Monday

## 2017-08-05 ENCOUNTER — Emergency Department (HOSPITAL_COMMUNITY): Payer: 59

## 2017-08-05 ENCOUNTER — Other Ambulatory Visit: Payer: Self-pay

## 2017-08-05 ENCOUNTER — Emergency Department (HOSPITAL_COMMUNITY)
Admission: EM | Admit: 2017-08-05 | Discharge: 2017-08-05 | Disposition: A | Discharge status: Home / Self Care | Payer: 59 | Attending: Emergency Medicine | Admitting: Emergency Medicine

## 2017-08-05 ENCOUNTER — Encounter (HOSPITAL_COMMUNITY): Payer: Self-pay | Admitting: Emergency Medicine

## 2017-08-05 DIAGNOSIS — Z79899 Other long term (current) drug therapy: Secondary | ICD-10-CM | POA: Insufficient documentation

## 2017-08-05 DIAGNOSIS — Y939 Activity, unspecified: Secondary | ICD-10-CM | POA: Diagnosis not present

## 2017-08-05 DIAGNOSIS — W230XXA Caught, crushed, jammed, or pinched between moving objects, initial encounter: Secondary | ICD-10-CM | POA: Insufficient documentation

## 2017-08-05 DIAGNOSIS — S60921A Unspecified superficial injury of right hand, initial encounter: Secondary | ICD-10-CM | POA: Diagnosis present

## 2017-08-05 DIAGNOSIS — Z87891 Personal history of nicotine dependence: Secondary | ICD-10-CM | POA: Diagnosis not present

## 2017-08-05 DIAGNOSIS — S60419A Abrasion of unspecified finger, initial encounter: Secondary | ICD-10-CM

## 2017-08-05 DIAGNOSIS — S60031A Contusion of right middle finger without damage to nail, initial encounter: Secondary | ICD-10-CM | POA: Insufficient documentation

## 2017-08-05 DIAGNOSIS — S60011A Contusion of right thumb without damage to nail, initial encounter: Secondary | ICD-10-CM | POA: Diagnosis not present

## 2017-08-05 DIAGNOSIS — Y929 Unspecified place or not applicable: Secondary | ICD-10-CM | POA: Diagnosis not present

## 2017-08-05 DIAGNOSIS — S60021A Contusion of right index finger without damage to nail, initial encounter: Secondary | ICD-10-CM

## 2017-08-05 DIAGNOSIS — Y999 Unspecified external cause status: Secondary | ICD-10-CM | POA: Insufficient documentation

## 2017-08-05 DIAGNOSIS — Z23 Encounter for immunization: Secondary | ICD-10-CM | POA: Diagnosis not present

## 2017-08-05 DIAGNOSIS — S60412A Abrasion of right middle finger, initial encounter: Secondary | ICD-10-CM | POA: Insufficient documentation

## 2017-08-05 MED ORDER — BACITRACIN ZINC 500 UNIT/GM EX OINT: 1.0000 "application " | TOPICAL_OINTMENT | Freq: Three times a day (TID) | CUTANEOUS | 1 refills | Status: DC

## 2017-08-05 MED ORDER — ACETAMINOPHEN 500 MG PO TABS: 500.0000 mg | ORAL_TABLET | Freq: Four times a day (QID) | ORAL | 0 refills | Status: AC | PRN

## 2017-08-05 MED ORDER — BACITRACIN ZINC 500 UNIT/GM EX OINT
1.0000 "application " | TOPICAL_OINTMENT | Freq: Two times a day (BID) | CUTANEOUS | Status: DC
  Administered 2017-08-05: 1 via TOPICAL
  Filled 2017-08-05: qty 0.9

## 2017-08-05 MED ORDER — TETANUS-DIPHTH-ACELL PERTUSSIS 5-2.5-18.5 LF-MCG/0.5 IM SUSP
0.5000 mL | Freq: Once | INTRAMUSCULAR | Status: AC
  Administered 2017-08-05: 0.5 mL via INTRAMUSCULAR
  Filled 2017-08-05: qty 0.5

## 2017-08-05 MED ORDER — ACETAMINOPHEN 325 MG PO TABS
650.0000 mg | ORAL_TABLET | Freq: Once | ORAL | Status: AC
  Administered 2017-08-05: 650 mg via ORAL
  Filled 2017-08-05: qty 2

## 2017-08-05 NOTE — ED Provider Notes (Signed)
[facility]-EMERGENCY DEPT Provider Note   CSN: [id number] Arrival date & time: 08/05/17  0029     History   Chief Complaint Chief Complaint  Patient presents with  . Hand Injury    HPI Melissa Kemp is a 21 y.o. female.  Melissa Kemp is a 21 y.o. Female who presents to the ED complaining of right finger pain after an injury prior to arrival.  Patient reports prior to arrival she slammed her first, second and third digits on her right hand in the car door.  She reports pain with movement of her fingers.  She is unsure when her last tetanus shot was.  She denies other injury.  She denies injury to her fourth or fifth fingers.  She denies injury or pain to her hand.  She denies numbness or tingling.  She attempted no treatments prior to arrival today.   The history is provided by the patient and medical records. No language interpreter was used.    History reviewed. No pertinent past medical history.  There are no active problems to display for this patient.   Past Surgical History:  Procedure Laterality Date  . DILATION AND EVACUATION      OB History    Gravida Para Term Preterm AB Living   3       1     SAB TAB Ectopic Multiple Live Births     1             Home Medications    Prior to Admission medications   Medication Sig Start Date End Date Taking? Authorizing Provider  acetaminophen (TYLENOL) 500 MG tablet Take 1 tablet (500 mg total) every 6 (six) hours as needed by mouth. 08/05/17   Everlene Farrier, PA-C  bacitracin ointment Apply 1 application 3 (three) times daily topically. 08/05/17   Everlene Farrier, PA-C  Norethindrone-Ethinyl Estradiol-Fe Biphas (LO LOESTRIN FE) 1 MG-10 MCG / 10 MCG tablet Take 1 tablet by mouth daily.    [provider]    Family History Family History  Problem Relation Age of Onset  . Hypertension Mother   . Hypertension Father     Social History Social History   Tobacco Use  . Smoking status:  Former Smoker    Last attempt to quit: 02/28/2016    Years since quitting: 1.4  . Smokeless tobacco: Never Used  Substance Use Topics  . Alcohol use: No  . Drug use: No     Allergies   Patient has no known allergies.   Review of Systems Review of Systems  Constitutional: Negative for fever.  Musculoskeletal: Positive for arthralgias and joint swelling.  Skin: Positive for wound. Negative for color change.  Neurological: Negative for weakness and numbness.     Physical Exam Updated Vital Signs BP 125/72 (BP Location: Right Arm)   Pulse 95   Temp 98.4 F (36.9 C) (Oral)   Resp 16   Ht 5\' 3"  (1.6 m)   Wt 49.4 kg (109 lb)   LMP 06/05/2017 (Exact Date)   SpO2 100%   BMI 19.31 kg/m   Physical Exam  Constitutional: She appears well-developed and well-nourished. No distress.  HENT:  Head: Normocephalic and atraumatic.  Eyes: Right eye exhibits no discharge. Left eye exhibits no discharge.  Cardiovascular: Normal rate, regular rhythm and intact distal pulses.  Bilateral radial pulses are intact.  Good capillary refill to her bilateral fingertips.  Pulmonary/Chest: Effort normal. No respiratory distress.  Musculoskeletal: She exhibits tenderness. She  exhibits no deformity.  Patient has tenderness to palpation diffusely to her right first, second and third digits.  This is worse around her PIP joints on her second and third digits.  No significant swelling.  No deformity noted.  There is a small abrasion noted to the medial aspect of her right third finger.  She is able to flex and extend her fingers without evidence of any tendon injury.  She does report pain with range of motion.  Neurological: She is alert. No sensory deficit. Coordination normal.  Sensation is intact to her distal fingertips bilaterally.  Skin: Skin is warm and dry. Capillary refill takes less than 2 seconds. No rash noted. She is not diaphoretic.  Psychiatric: She has a normal mood and affect. Her behavior  is normal.  Nursing note and vitals reviewed.    ED Treatments / Results  Labs (all labs ordered are listed, but only abnormal results are displayed) Labs Reviewed - No data to display  EKG  EKG Interpretation None       Radiology Dg Hand Complete Right  Result Date: 08/05/2017 CLINICAL DATA:  Shut RIGHT hand in car door. EXAM: RIGHT HAND - COMPLETE 3+ VIEW COMPARISON:  None. FINDINGS: There is no evidence of fracture or dislocation. Broken first digit acrylic nail. There is no evidence of arthropathy or other focal bone abnormality. Soft tissues are unremarkable. IMPRESSION: No acute osseous process. Electronically Signed   By: Awilda Metroourtnay  Bloomer M.D.   On: 08/05/2017 01:24    Procedures Procedures (including critical care time)  Medications Ordered in ED Medications  bacitracin ointment 1 application (not administered)  acetaminophen (TYLENOL) tablet 650 mg (not administered)  Tdap (BOOSTRIX) injection 0.5 mL (not administered)     Initial Impression / Assessment and Plan / ED Course  I have reviewed the triage vital signs and the nursing notes.  Pertinent labs & imaging results that were available during my care of the patient were reviewed by me and considered in my medical decision making (see chart for details).    This is a 21 y.o. Female who presents to the ED complaining of right finger pain after an injury prior to arrival.  Patient reports prior to arrival she slammed her first, second and third digits on her right hand in the car door.  She reports pain with movement of her fingers.  She is unsure when her last tetanus shot was.  She denies other injury.  She denies injury to her fourth or fifth fingers.  She denies injury or pain to her hand.  She denies numbness or tingling. On exam patient has pain to her first, second and third fingers of her right hand.  There is no deformity.  She is neurovascular intact.  X-ray shows no bony abnormality.  She is able to  make a fist and shows good range of motion, albeit with pain.  No evidence of tendon injury at this time.  Will place in buddy tape and finger splint to help with her pain.  Also encouraged her to use bacitracin on the abrasion.  Tetanus updated in the emergency department.  I encouraged her to use ice and elevation for pain control as well as Tylenol.  I advised if her pain persists she needs to follow-up with primary care and/or hand surgery which is provided and follow-up instructions.  Return precautions discussed. I advised the patient to follow-up with their primary care provider this week. I advised the patient to return to the emergency  department with new or worsening symptoms or new concerns. The patient verbalized understanding and agreement with plan.     Final Clinical Impressions(s) / ED Diagnoses   Final diagnoses:  Contusion of right thumb without damage to nail, initial encounter  Contusion of right index finger without damage to nail, initial encounter  Contusion of right middle finger without damage to nail, initial encounter  Abrasion of finger, initial encounter    ED Discharge Orders        Ordered    acetaminophen (TYLENOL) 500 MG tablet  Every 6 hours PRN     08/05/17 0342    bacitracin ointment  3 times daily     08/05/17 0342       Everlene FarrierDansie, Taelon Bendorf, PA-C 08/05/17 0355    Molpus, Jonny RuizJohn, MD 08/05/17 [phone]

## 2017-08-05 NOTE — ED Triage Notes (Signed)
Pt states she shut her right hand in the car door about an hour ago  Pt states she is unable to move her thumb and her middle finger  Pt has swelling to her thumb and fingers  Pt also has bruising and a small laceration to her fingers  Pt states her pinky finger is okay

## 2017-09-26 NOTE — L&D Delivery Note (Addendum)
Patient: Melissa Kemp MRN: [id number]  GBS status: neg  Patient is a 22 y.o. now G2P0111 s/p NSVD at [redacted]w[redacted]d, who was admitted for PPROM which occurred 3h 35m prior to delivery with clear fluid, and progressed spontaneously with PTL.  Delivery Note At 3:58 AM a viable female was delivered via Vaginal, Spontaneous (Presentation: vertex; ROA).  APGAR: 8, 9;   Placenta status: intact, sent to L&D.  Cord:3-vessel  with no complications.  Anesthesia:  None Episiotomy: None Lacerations: 1st degree Suture Repair: 3.0 monocryl Est. Blood Loss (mL):  Mom to postpartum.  Baby to Couplet care / Skin to Skin.  Head delivered ROA. No nuchal cord present. Shoulder and body delivered in usual fashion. Infant with spontaneous cry, placed on mother's abdomen, dried and bulb suctioned. Cord clamped x 2 after 1-minute delay, and cut by family member. Cord blood drawn. Placenta delivered spontaneously with gentle cord traction. Fundus firm with massage and Pitocin. Perineum inspected and found to have 1st degree laceration, which was repaired with 3.0 monocryl suture with good hemostasis achieved.  [name] 02/13/2018, 4:35 AM  OB FELLOW MEDICAL STUDENT NOTE ATTESTATION I confirm that I have verified the information documented in the medical student's note and I was present for the entirety of delivery. Uncomplicated delivery. 1st degree perineal laceration repaired by me with good hemostasis  [name], MD OB Fellow 02/13/2018, 5:49 AM

## 2017-10-30 LAB — OB RESULTS CONSOLE HEPATITIS B SURFACE ANTIGEN: Hepatitis B Surface Ag: NEGATIVE

## 2017-10-30 LAB — OB RESULTS CONSOLE GC/CHLAMYDIA
Chlamydia: NEGATIVE
Gonorrhea: NEGATIVE

## 2017-10-30 LAB — OB RESULTS CONSOLE RUBELLA ANTIBODY, IGM: Rubella: IMMUNE

## 2017-10-30 LAB — OB RESULTS CONSOLE RPR: RPR: NONREACTIVE

## 2017-10-30 LAB — OB RESULTS CONSOLE HIV ANTIBODY (ROUTINE TESTING): HIV: NONREACTIVE

## 2017-10-30 IMAGING — US US OB COMP LESS 14 WK
1 series · 14 of 28 positions shown · non-contrast
Comparison: None.

CLINICAL DATA: Vaginal bleeding and first-trimester pregnancy.

EXAM:
OBSTETRIC <14 WK US AND TRANSVAGINAL OB US
TECHNIQUE: Both transabdominal and transvaginal ultrasound examinations were
performed for complete evaluation of the gestation as well as the
maternal uterus, adnexal regions, and pelvic cul-de-sac.
Transvaginal technique was performed to assess early pregnancy.

[Series 1: us ob comp less 14 wk · 0.17mm/px · 68 acquisitions, 14 frames shown]
[im 3/68]
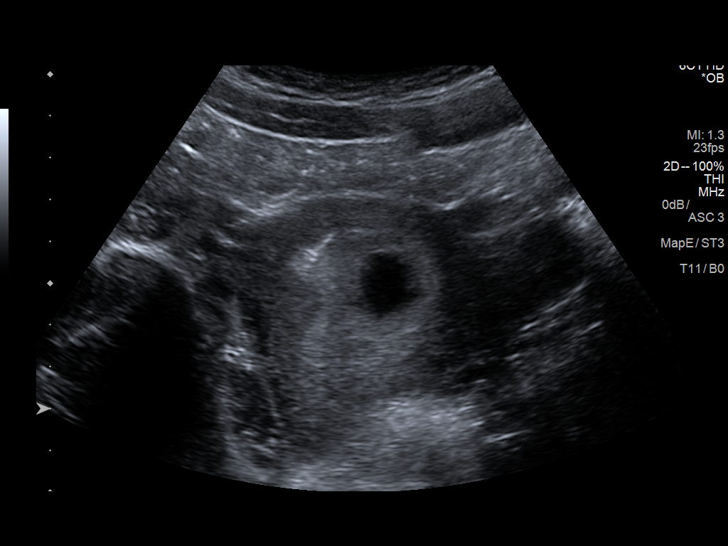
[im 8/68]
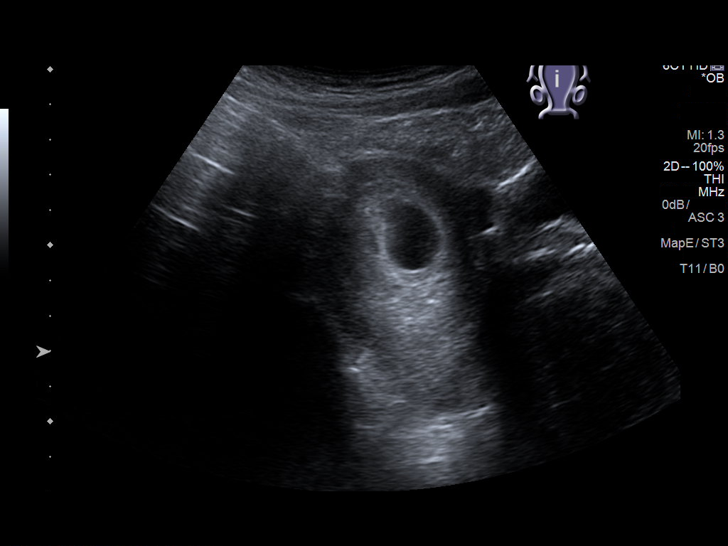
[im 13/68]
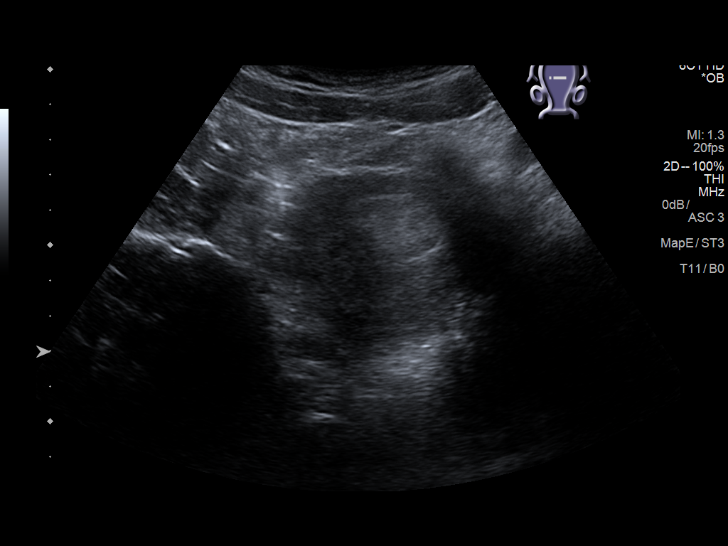
[im 18/68]
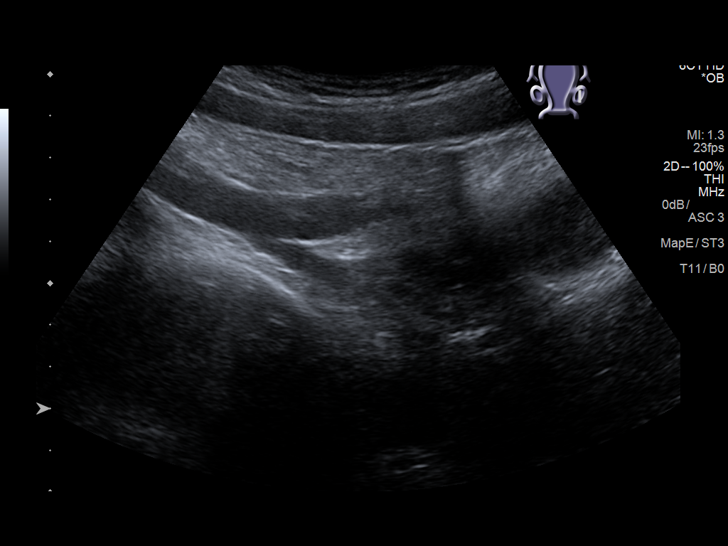
[im 23/68]
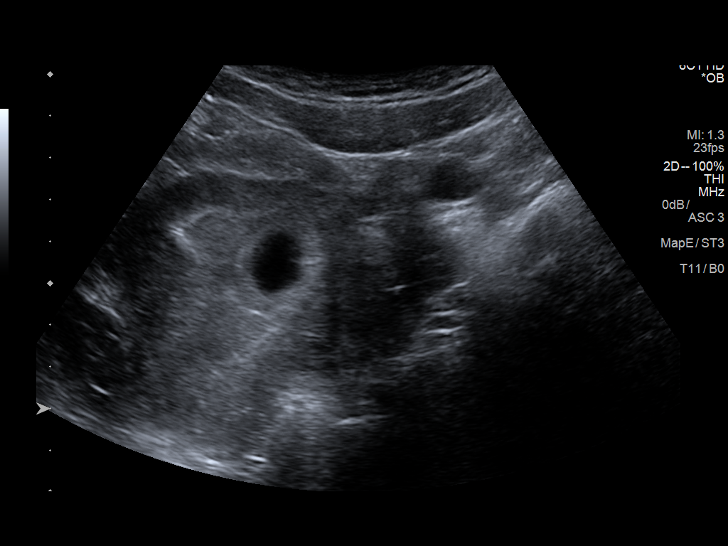
[im 28/68]
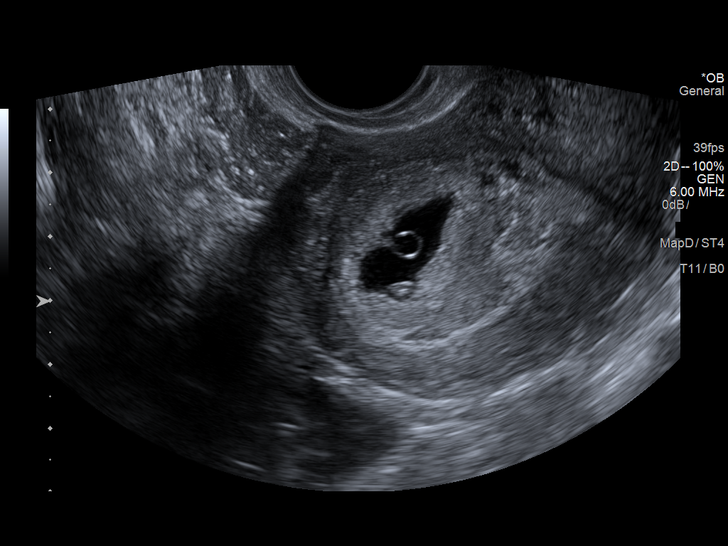
[im 33/68]
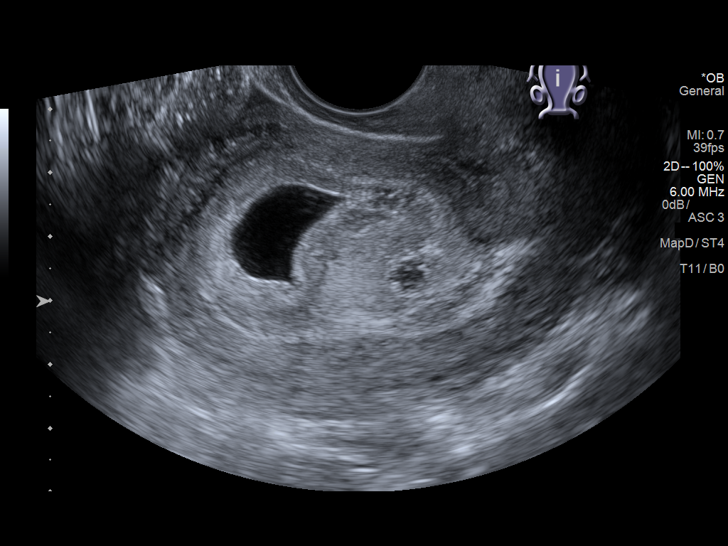
[im 38/68]
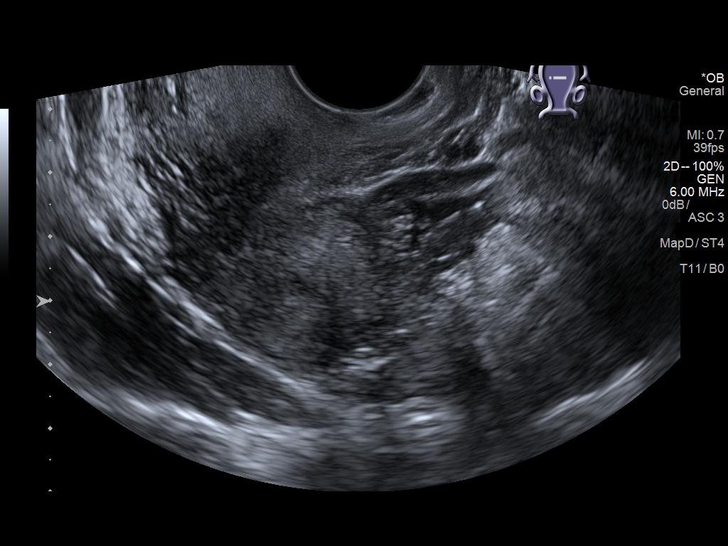
[im 43/68]
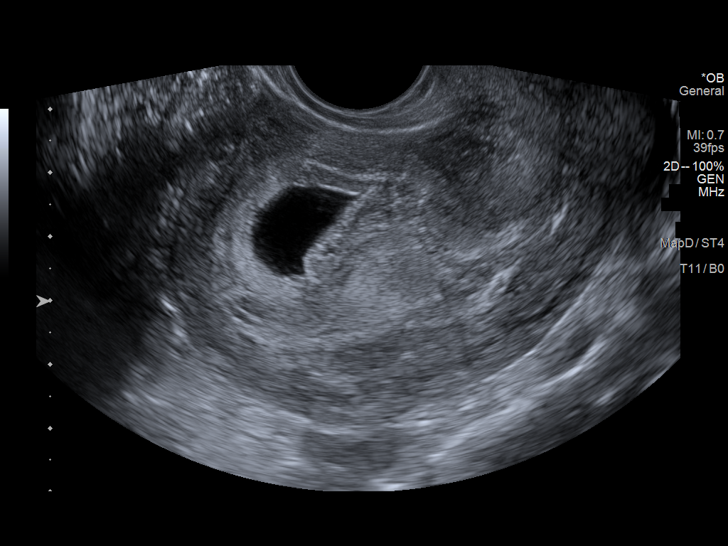
[im 48/68]
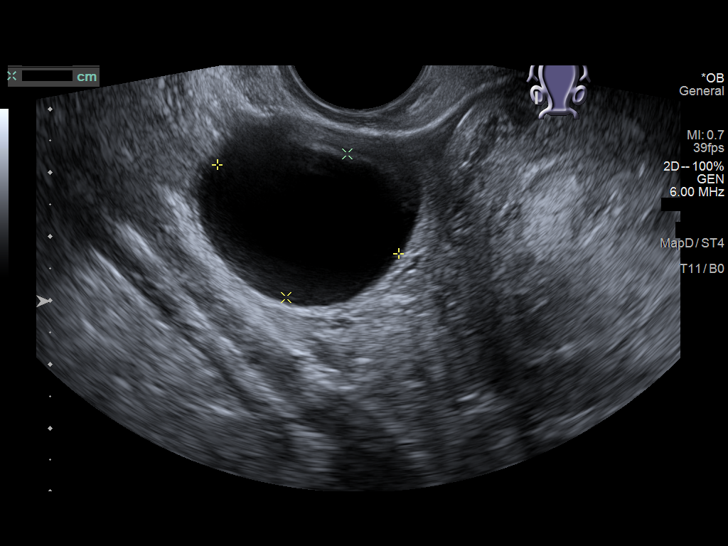
[im 53/68]
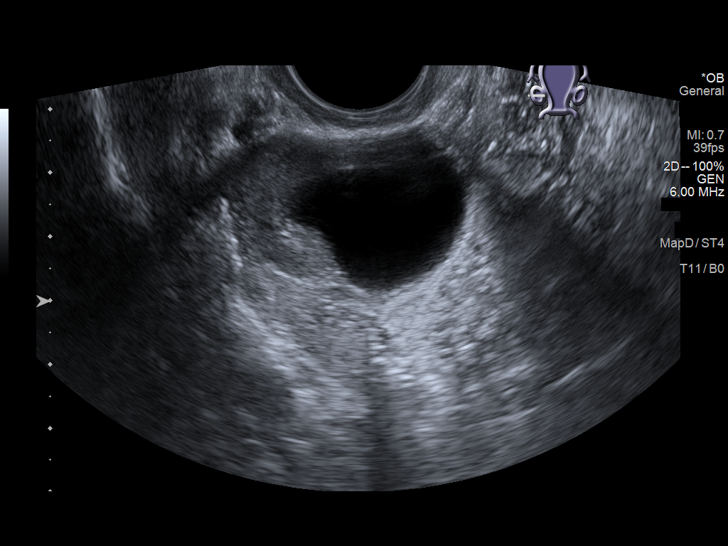
[im 58/68]
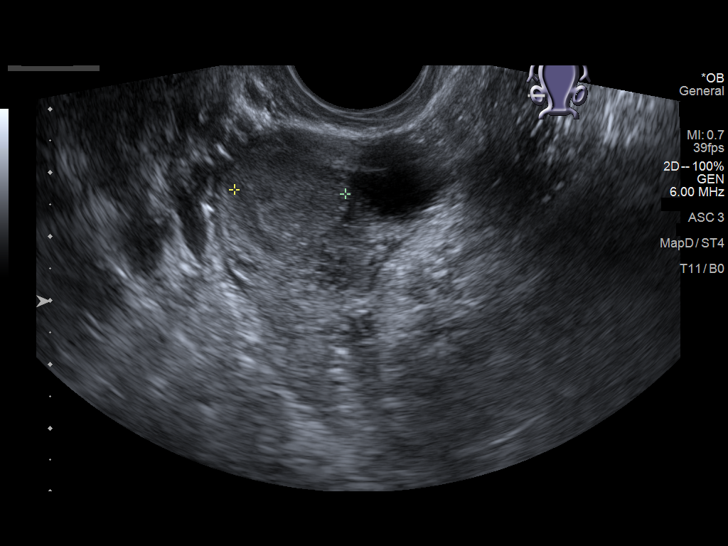
[im 63/68]
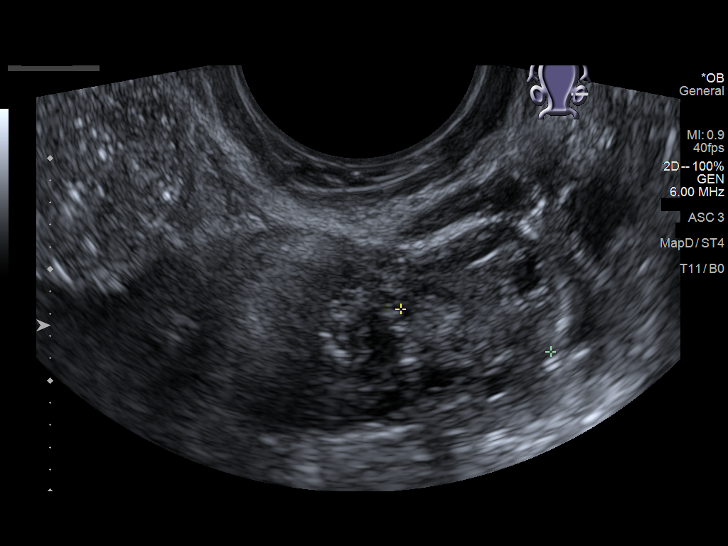
[im 68/68]
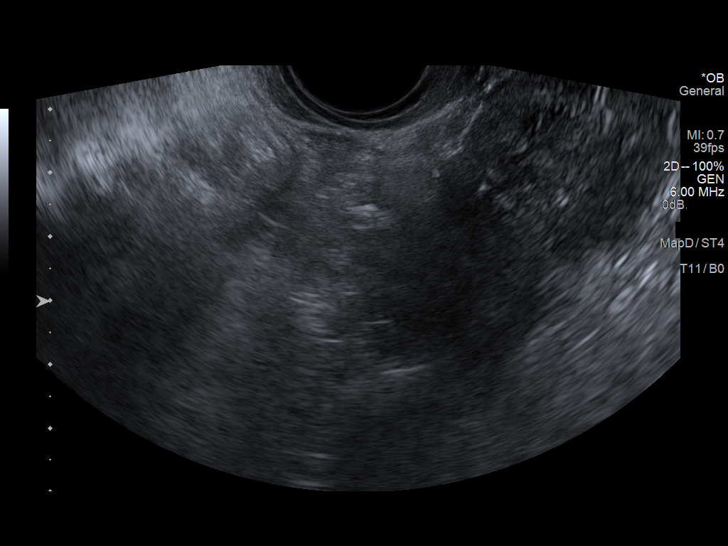

[14 of 28 positions shown; findings below may reference images not displayed]

FINDINGS: Intrauterine gestational sac: Present

Yolk sac:  Present

Embryo:  Present

Cardiac Activity: Normal cardiac activity not yet seen

CRL:  4.6  mm   6 w   1 d                  US EDC: 10/24/2016

Subchorionic hemorrhage: 2 cm predominant hyperechoic area with
central hypoechoic 8 mm component. Especially given the history this
is most likely a mixed echogenicity hematoma. Failed, partially
collapsed second gestational sac is an alternate consideration.

Maternal uterus/adnexae: Simple 3 cm cyst on the right.
IMPRESSION: 1. Intrauterine pregnancy measuring 6 weeks 1 day. Normal cardiac
activity is not yet seen.
2. 2 cm mixed echogenicity structure in the endometrial cavity is
likely a hematoma.
3. Recommend surveillance.

## 2018-02-07 LAB — OB RESULTS CONSOLE GBS: GBS: NEGATIVE

## 2018-02-13 ENCOUNTER — Encounter (HOSPITAL_COMMUNITY): Payer: Self-pay

## 2018-02-13 ENCOUNTER — Other Ambulatory Visit: Payer: Self-pay

## 2018-02-13 ENCOUNTER — Inpatient Hospital Stay (HOSPITAL_COMMUNITY)
Admission: AD | Admit: 2018-02-13 | Discharge: 2018-02-15 | DRG: 805 | Disposition: A | Discharge status: Home / Self Care | Payer: 59 | Source: Ambulatory Visit | Attending: Obstetrics & Gynecology | Admitting: Obstetrics & Gynecology

## 2018-02-13 DIAGNOSIS — O42913 Preterm premature rupture of membranes, unspecified as to length of time between rupture and onset of labor, third trimester: Secondary | ICD-10-CM | POA: Diagnosis present

## 2018-02-13 DIAGNOSIS — Z87891 Personal history of nicotine dependence: Secondary | ICD-10-CM | POA: Diagnosis not present

## 2018-02-13 DIAGNOSIS — O1490 Unspecified pre-eclampsia, unspecified trimester: Secondary | ICD-10-CM

## 2018-02-13 DIAGNOSIS — O42919 Preterm premature rupture of membranes, unspecified as to length of time between rupture and onset of labor, unspecified trimester: Secondary | ICD-10-CM | POA: Diagnosis present

## 2018-02-13 DIAGNOSIS — O42013 Preterm premature rupture of membranes, onset of labor within 24 hours of rupture, third trimester: Secondary | ICD-10-CM

## 2018-02-13 DIAGNOSIS — Z3A36 36 weeks gestation of pregnancy: Secondary | ICD-10-CM

## 2018-02-13 LAB — COMPREHENSIVE METABOLIC PANEL
ALBUMIN: 3.1 g/dL — AB (ref 3.5–5.0)
ALK PHOS: 198 U/L — AB (ref 38–126)
ALT: 15 U/L (ref 14–54)
ALT: 14 U/L (ref 14–54)
AST: 23 U/L (ref 15–41)
AST: 26 U/L (ref 15–41)
Albumin: 2.8 g/dL — ABNORMAL LOW (ref 3.5–5.0)
Alkaline Phosphatase: 166 U/L — ABNORMAL HIGH (ref 38–126)
Anion gap: 12 (ref 5–15)
Anion gap: 11 (ref 5–15)
BILIRUBIN TOTAL: 0.4 mg/dL (ref 0.3–1.2)
BUN: 9 mg/dL (ref 6–20)
BUN: 10 mg/dL (ref 6–20)
CALCIUM: 9.7 mg/dL (ref 8.9–10.3)
CHLORIDE: 107 mmol/L (ref 101–111)
CO2: 18 mmol/L — AB (ref 22–32)
CO2: 18 mmol/L — ABNORMAL LOW (ref 22–32)
CREATININE: 0.96 mg/dL (ref 0.44–1.00)
CREATININE: 0.74 mg/dL (ref 0.44–1.00)
Calcium: 9.3 mg/dL (ref 8.9–10.3)
Chloride: 106 mmol/L (ref 101–111)
GFR calc Af Amer: >60 mL/min (ref >60)
GFR calc Af Amer: >60 mL/min (ref >60)
GFR calc non Af Amer: >60 mL/min (ref >60)
GLUCOSE: 91 mg/dL (ref 65–99)
Glucose, Bld: 102 mg/dL — ABNORMAL HIGH (ref 65–99)
POTASSIUM: 4.1 mmol/L (ref 3.5–5.1)
Potassium: 4.3 mmol/L (ref 3.5–5.1)
SODIUM: 136 mmol/L (ref 135–145)
Sodium: 136 mmol/L (ref 135–145)
Total Bilirubin: 0.2 mg/dL — ABNORMAL LOW (ref 0.3–1.2)
Total Protein: 6.9 g/dL (ref 6.5–8.1)
Total Protein: 6 g/dL — ABNORMAL LOW (ref 6.5–8.1)

## 2018-02-13 LAB — TYPE AND SCREEN
ABO/RH(D): A POS
Antibody Screen: NEGATIVE

## 2018-02-13 LAB — CBC
HEMATOCRIT: 34.8 % — AB (ref 36.0–46.0)
HEMATOCRIT: 31.5 % — AB (ref 36.0–46.0)
HEMOGLOBIN: 11.5 g/dL — AB (ref 12.0–15.0)
Hemoglobin: 10.3 g/dL — ABNORMAL LOW (ref 12.0–15.0)
MCH: 29.1 pg (ref 26.0–34.0)
MCH: 29.2 pg (ref 26.0–34.0)
MCHC: 33 g/dL (ref 30.0–36.0)
MCHC: 32.7 g/dL (ref 30.0–36.0)
MCV: 88.1 fL (ref 78.0–100.0)
MCV: 89.2 fL (ref 78.0–100.0)
PLATELETS: 230 10*3/uL (ref 150–400)
Platelets: 252 10*3/uL (ref 150–400)
RBC: 3.95 MIL/uL (ref 3.87–5.11)
RBC: 3.53 MIL/uL — AB (ref 3.87–5.11)
RDW: 15.2 % (ref 11.5–15.5)
RDW: 15 % (ref 11.5–15.5)
WBC: 8.4 10*3/uL (ref 4.0–10.5)
WBC: 15.9 10*3/uL — AB (ref 4.0–10.5)

## 2018-02-13 LAB — RAPID URINE DRUG SCREEN, HOSP PERFORMED
Amphetamines: NOT DETECTED
BARBITURATES: NOT DETECTED
Benzodiazepines: NOT DETECTED
COCAINE: NOT DETECTED
Opiates: NOT DETECTED
TETRAHYDROCANNABINOL: NOT DETECTED

## 2018-02-13 LAB — POCT FERN TEST: POCT Fern Test: POSITIVE

## 2018-02-13 LAB — PROTEIN / CREATININE RATIO, URINE
Creatinine, Urine: 231 mg/dL
Protein Creatinine Ratio: 0.36 mg/mg{Cre} — ABNORMAL HIGH (ref 0.00–0.15)
Total Protein, Urine: 83 mg/dL

## 2018-02-13 LAB — RAPID HIV SCREEN (HIV 1/2 AB+AG)
HIV 1/2 Antibodies: NONREACTIVE
HIV-1 P24 ANTIGEN - HIV24: NONREACTIVE

## 2018-02-13 LAB — RPR: RPR Ser Ql: NONREACTIVE

## 2018-02-13 MED ORDER — WITCH HAZEL-GLYCERIN EX PADS: 1.0000 "application " | MEDICATED_PAD | CUTANEOUS | Status: DC | PRN

## 2018-02-13 MED ORDER — EPHEDRINE 5 MG/ML INJ: 10.0000 mg | INTRAVENOUS | Status: DC | PRN

## 2018-02-13 MED ORDER — TETANUS-DIPHTH-ACELL PERTUSSIS 5-2.5-18.5 LF-MCG/0.5 IM SUSP: 0.5000 mL | Freq: Once | INTRAMUSCULAR | Status: DC

## 2018-02-13 MED ORDER — PHENYLEPHRINE 40 MCG/ML (10ML) SYRINGE FOR IV PUSH (FOR BLOOD PRESSURE SUPPORT): 80.0000 ug | PREFILLED_SYRINGE | INTRAVENOUS | Status: DC | PRN

## 2018-02-13 MED ORDER — OXYTOCIN BOLUS FROM INFUSION
500.0000 mL | Freq: Once | INTRAVENOUS | Status: AC
  Administered 2018-02-13: 500 mL via INTRAVENOUS

## 2018-02-13 MED ORDER — SIMETHICONE 80 MG PO CHEW: 80.0000 mg | CHEWABLE_TABLET | ORAL | Status: DC | PRN

## 2018-02-13 MED ORDER — DIPHENHYDRAMINE HCL 50 MG/ML IJ SOLN: 12.5000 mg | INTRAMUSCULAR | Status: DC | PRN

## 2018-02-13 MED ORDER — SENNOSIDES-DOCUSATE SODIUM 8.6-50 MG PO TABS
2.0000 | ORAL_TABLET | ORAL | Status: DC
  Administered 2018-02-13 – 2018-02-15 (×2): 2 via ORAL
  Filled 2018-02-13 (×2): qty 2

## 2018-02-13 MED ORDER — FENTANYL 2.5 MCG/ML BUPIVACAINE 1/10 % EPIDURAL INFUSION (WH - ANES): 14.0000 mL/h | INTRAMUSCULAR | Status: DC | PRN

## 2018-02-13 MED ORDER — ONDANSETRON HCL 4 MG/2ML IJ SOLN: 4.0000 mg | Freq: Four times a day (QID) | INTRAMUSCULAR | Status: DC | PRN

## 2018-02-13 MED ORDER — PHENYLEPHRINE 40 MCG/ML (10ML) SYRINGE FOR IV PUSH (FOR BLOOD PRESSURE SUPPORT)
PREFILLED_SYRINGE | INTRAVENOUS | Status: AC
  Filled 2018-02-13: qty 10

## 2018-02-13 MED ORDER — BETAMETHASONE SOD PHOS & ACET 6 (3-3) MG/ML IJ SUSP
12.0000 mg | INTRAMUSCULAR | Status: DC
  Administered 2018-02-13: 12 mg via INTRAMUSCULAR
  Filled 2018-02-13: qty 2

## 2018-02-13 MED ORDER — FLEET ENEMA 7-19 GM/118ML RE ENEM: 1.0000 | ENEMA | RECTAL | Status: DC | PRN

## 2018-02-13 MED ORDER — OXYCODONE-ACETAMINOPHEN 5-325 MG PO TABS: 1.0000 | ORAL_TABLET | ORAL | Status: DC | PRN

## 2018-02-13 MED ORDER — FENTANYL CITRATE (PF) 100 MCG/2ML IJ SOLN
100.0000 ug | INTRAMUSCULAR | Status: DC | PRN
  Filled 2018-02-13: qty 2

## 2018-02-13 MED ORDER — ACETAMINOPHEN 325 MG PO TABS
650.0000 mg | ORAL_TABLET | ORAL | Status: DC | PRN
  Administered 2018-02-13 (×2): 650 mg via ORAL
  Filled 2018-02-13 (×2): qty 2

## 2018-02-13 MED ORDER — BENZOCAINE-MENTHOL 20-0.5 % EX AERO
1.0000 "application " | INHALATION_SPRAY | CUTANEOUS | Status: DC | PRN
  Administered 2018-02-13: 1 via TOPICAL
  Filled 2018-02-13: qty 56

## 2018-02-13 MED ORDER — COCONUT OIL OIL: 1.0000 "application " | TOPICAL_OIL | Status: DC | PRN

## 2018-02-13 MED ORDER — ONDANSETRON HCL 4 MG PO TABS: 4.0000 mg | ORAL_TABLET | ORAL | Status: DC | PRN

## 2018-02-13 MED ORDER — IBUPROFEN 600 MG PO TABS
600.0000 mg | ORAL_TABLET | Freq: Once | ORAL | Status: AC
Start: 2018-02-13 — End: 2018-02-13
  Administered 2018-02-13: 600 mg via ORAL
  Filled 2018-02-13: qty 1

## 2018-02-13 MED ORDER — LACTATED RINGERS IV SOLN: 500.0000 mL | INTRAVENOUS | Status: DC | PRN

## 2018-02-13 MED ORDER — PRENATAL MULTIVITAMIN CH
1.0000 | ORAL_TABLET | Freq: Every day | ORAL | Status: DC
  Administered 2018-02-13 – 2018-02-15 (×3): 1 via ORAL
  Filled 2018-02-13 (×3): qty 1

## 2018-02-13 MED ORDER — DIBUCAINE 1 % RE OINT: 1.0000 "application " | TOPICAL_OINTMENT | RECTAL | Status: DC | PRN

## 2018-02-13 MED ORDER — DIPHENHYDRAMINE HCL 25 MG PO CAPS: 25.0000 mg | ORAL_CAPSULE | Freq: Four times a day (QID) | ORAL | Status: DC | PRN

## 2018-02-13 MED ORDER — IBUPROFEN 600 MG PO TABS
600.0000 mg | ORAL_TABLET | Freq: Four times a day (QID) | ORAL | Status: DC
  Administered 2018-02-13 – 2018-02-15 (×9): 600 mg via ORAL
  Filled 2018-02-13 (×8): qty 1

## 2018-02-13 MED ORDER — ONDANSETRON HCL 4 MG/2ML IJ SOLN: 4.0000 mg | INTRAMUSCULAR | Status: DC | PRN

## 2018-02-13 MED ORDER — FENTANYL 2.5 MCG/ML BUPIVACAINE 1/10 % EPIDURAL INFUSION (WH - ANES)
INTRAMUSCULAR | Status: AC
  Filled 2018-02-13: qty 100

## 2018-02-13 MED ORDER — OXYTOCIN 40 UNITS IN LACTATED RINGERS INFUSION - SIMPLE MED
2.5000 [IU]/h | INTRAVENOUS | Status: DC
  Filled 2018-02-13: qty 1000

## 2018-02-13 MED ORDER — OXYCODONE-ACETAMINOPHEN 5-325 MG PO TABS: 2.0000 | ORAL_TABLET | ORAL | Status: DC | PRN

## 2018-02-13 MED ORDER — LIDOCAINE HCL (PF) 1 % IJ SOLN
30.0000 mL | INTRAMUSCULAR | Status: AC | PRN
  Administered 2018-02-13: 30 mL via SUBCUTANEOUS
  Filled 2018-02-13: qty 30

## 2018-02-13 MED ORDER — SOD CITRATE-CITRIC ACID 500-334 MG/5ML PO SOLN: 30.0000 mL | ORAL | Status: DC | PRN

## 2018-02-13 MED ORDER — ACETAMINOPHEN 325 MG PO TABS: 650.0000 mg | ORAL_TABLET | ORAL | Status: DC | PRN

## 2018-02-13 MED ORDER — ZOLPIDEM TARTRATE 5 MG PO TABS: 5.0000 mg | ORAL_TABLET | Freq: Every evening | ORAL | Status: DC | PRN

## 2018-02-13 MED ORDER — LACTATED RINGERS IV SOLN
INTRAVENOUS | Status: DC
  Administered 2018-02-13 (×2): via INTRAVENOUS

## 2018-02-13 NOTE — Discharge Summary (Signed)
OB Discharge Summary     Patient Name: Melissa Kemp DOB: 1995-10-22 MRN: [id number] Date of admission: 02/13/2018  Delivering MD: [name] )  Date of discharge: 02/15/2018   Admitting diagnosis: 36 WEEKS ROM ABD PAIN Intrauterine pregnancy: [redacted]w[redacted]d    Secondary diagnosis:  Active Problems:   Patient Active Problem List   Diagnosis Date Noted  . Preterm premature rupture of membranes (PPROM) with unknown onset of labor 02/13/2018  . Preterm delivery, delivered 02/13/2018  . Preeclampsia 02/13/2018    Additional problems: Preeclampsia, Anemia     Discharge diagnosis: Preterm Pregnancy Delivered                                                                                                Post partum procedures:None  Complications: None  Hospital course:  Onset of Labor With Vaginal Delivery     22 y.o. yo [name] at [redacted]w[redacted]d was admitted in Latent Labor on 02/13/2018 for PPROM. Patient had an uncomplicated labor course as follows:  Membrane Rupture Time/Date: 12:00 AM ,02/13/2018   Intrapartum Procedures: Episiotomy: None [1]                                         Lacerations:  1st degree [2]  Patient had a delivery of a Viable infant. 02/13/2018  Information for the patient's newborn:  Melissa Kemp, Girl [place] [[id number]]  Delivery Method: Vag-Spont    Prior to delivery, patient had elevated BP, UPC 0.36, and creatine 0.96.  Patient had an uncomplicated postpartum course.  Her creatinine downtrended to 0.74, had normotensive BP, and no symptoms of HA, vision changes, or epigastric/RUQ pain.  She is ambulating, tolerating a regular diet, passing flatus, and urinating well. Patient is discharged home in stable condition on 02/15/18.   Physical exam  Vitals:   02/14/18 1845 02/15/18 0554  BP: (!) 114/52 123/84  Pulse: 84 72  Resp: 18 18  Temp: 98.1 F (36.7 C) 98 F (36.7 C)    General: alert, cooperative and no distress Lochia: appropriate Uterine Fundus: firm Incision:  N/A DVT Evaluation: No evidence of DVT seen on physical exam.  Labs: No results found for this or any previous visit (from the past 24 hour(s)).   Discharge instruction: per After Visit Summary and "Baby and Me Booklet".  After visit meds:  No Known Allergies  Allergies as of 02/15/2018   No Known Allergies     Medication List    TAKE these medications   acetaminophen 500 MG tablet Commonly known as:  TYLENOL Take 1 tablet (500 mg total) every 6 (six) hours as needed by mouth.   ibuprofen 600 MG tablet Commonly known as:  ADVIL,MOTRIN Take 1 tablet (600 mg total) by mouth every 6 (six) hours as needed.   multivitamin-prenatal 27-0.8 MG Tabs tablet Take 1 tablet by mouth daily at 12 noon.        Diet: routine diet  Activity: Advance as tolerated. Pelvic rest for 6 weeks.   Outpatient follow up: 1  week for BP check  Postpartum contraception: Nexplanon  Newborn Data: APGAR (1 MIN): 8   APGAR (5 MINS): 9    Baby Feeding: Bottle and Breast Disposition:home with mother  [name], Medical Student  02/15/2018  OB FELLOW MEDICAL STUDENT NOTE ATTESTATION I confirm that I have verified the information documented in the medical student's note and that I have also personally performed the physical exam and all medical decision making activities.  Patient doing well at the time of discharge. Exam wnl. Dx with PrE intrapartum. Now blood pressures wnl. Will set up for BP check with [place] w/in a week. Planning for Nexplanon for contraception at the HD.  [name], MD OB Fellow 02/15/2018, 7:22 AM

## 2018-02-13 NOTE — H&P (Addendum)
OBSTETRIC ADMISSION HISTORY AND PHYSICAL  Melissa Kemp is a 22 y.o. female G3P0010 with IUP at [redacted]w[redacted]d by [place] presenting for PPROM at midnight. She reports clear fluid.  She reports +FMs, No LOF, no VB, no blurry vision, headaches or peripheral edema, and RUQ pain.  She plans on breast and bottle feeding. She requests Nexplanon for birth control. She received her prenatal care at [facility]   Patient reports no pregnancy complications, no prior medical history, and no other medications besides Tylenol and prenatal vitamins.  She currently feels nervous and is able to feel contractions.  Per record review, patient has history of marijuana and alcohol use that was stopped after +UPT. UDS was neg.  She also has a history of anemia (Hgb 10.1 Hct 31 01/03/2018)  Dating: By [place] --->  Estimated Date of Delivery: 03/12/18  Sono:   , CWD, normal anatomy, record not available for review   Past Medical History: No past medical history on file.  Past Surgical History: Past Surgical History:  Procedure Laterality Date  . DILATION AND EVACUATION      Obstetrical History: OB History    Gravida  3   Para      Term      Preterm      AB  1   Living        SAB      TAB  1   Ectopic      Multiple      Live Births              Social History: Social History   Socioeconomic History  . Marital status: Single    Spouse name: Not on file  . Number of children: Not on file  . Years of education: Not on file  . Highest education level: Not on file  Occupational History  . Not on file  Social Needs  . Financial resource strain: Not on file  . Food insecurity:    Worry: Not on file    Inability: Not on file  . Transportation needs:    Medical: Not on file    Non-medical: Not on file  Tobacco Use  . Smoking status: Former Smoker    Last attempt to quit: 02/28/2016    Years since quitting: 1.9  . Smokeless tobacco: Never Used  Substance and Sexual Activity  .  Alcohol use: No  . Drug use: No  . Sexual activity: Yes    Birth control/protection: None  Lifestyle  . Physical activity:    Days per week: Not on file    Minutes per session: Not on file  . Stress: Not on file  Relationships  . Social connections:    Talks on phone: Not on file    Gets together: Not on file    Attends religious service: Not on file    Active member of club or organization: Not on file    Attends meetings of clubs or organizations: Not on file    Relationship status: Not on file  Other Topics Concern  . Not on file  Social History Narrative  . Not on file    Family History: Family History  Problem Relation Age of Onset  . Hypertension Mother   . Hypertension Father     Allergies: No Known Allergies  Medications Prior to Admission  Medication Sig Dispense Refill Last Dose  . acetaminophen (TYLENOL) 500 MG tablet Take 1 tablet (500 mg total) every 6 (six) hours as needed  by mouth. 30 tablet 0   . bacitracin ointment Apply 1 application 3 (three) times daily topically. 15 g 1   . Norethindrone-Ethinyl Estradiol-Fe Biphas (LO LOESTRIN FE) 1 MG-10 MCG / 10 MCG tablet Take 1 tablet by mouth daily.   08/02/2016 at Unknown time     Review of Systems   All systems reviewed and negative except as stated in HPI  Blood pressure (!) 142/85, temperature 98.4 F (36.9 C), temperature source Oral, resp. rate 18, height  (1.6 m), weight 149 lb (67.6 kg), last menstrual period 06/05/2017, unknown if currently breastfeeding. General appearance: alert, cooperative and nervous Lungs: clear to auscultation bilaterally Heart: regular rate and rhythm Abdomen: soft, non-tender; bowel sounds normal Extremities: Homans sign is negative, no sign of DVT DTR's present Presentation: deferred Fetal monitoringBaseline: 140 bpm, Variability: Good {> 6 bpm), Accelerations: Reactive and Decelerations: Absent Uterine activityFrequency: Every 2-3 minutes Dilation:  3 Effacement (%): 90 Station: -1 Exam by:: Camelia Eng RN   Prenatal labs: ABO, Rh:  A+, neg Antibody:   neg Rubella:  Immune RPR:   Neg HBsAg:   Neg HIV:   Neg 10/30/17 GBS:   Neg 1 hr Glucola Not completed Genetic screening  Nml, record not available for review Anatomy US nml, record not available for review  Prenatal Transfer Tool  Maternal Diabetes: No Genetic Screening: Normal Maternal Ultrasounds/Referrals: Normal Fetal Ultrasounds or other Referrals:  None Maternal Substance Abuse:  Yes:  Type: Other: History of marijuana use.  Neg UDS Significant Maternal Medications:  None Significant Maternal Lab Results: None  Results for orders placed or performed during the hospital encounter of 02/13/18 (from the past 24 hour(s))  POCT fern test   Collection Time: 02/13/18 12:55 AM  Result Value Ref Range   POCT Fern Test Positive = ruptured amniotic membanes     There are no active problems to display for this patient.   Assessment/Plan:  [name] is a 22 y.o. G3P0010 at [redacted]w[redacted]d here for PPROM at midnight.    #Labor: PPROM.  Betamethasone.  Expectant management.  If labor does not progress, plan for induction. #Pain: Analgesics as needed. #FWB: Cat I #ID:  GBS neg #MOF: Breast & Bottlefeeding #MOC: Nexplanon #Circ:  N/A  [name], Medical Student  02/13/2018, 1:07 AM   OB FELLOW MEDICAL STUDENT NOTE ATTESTATION I confirm that I have verified the information documented in the medical student's note and that I have also personally performed the physical exam and all medical decision making activities.  22 y.o. G2P0010 with IUP at [redacted]w[redacted]d presenting after ROM around 2330 tonight. Has been contracting regularly since.  - Expectant management for now. Augmented labor if needed given ROM - BMZ for GA < 37 weeks - GBS neg, no abx indicated - FHT Cat I  Initial BP elevated; asymptomatic - Check CBC, CMP, UPC - Continue to monitor BPs and signs/sx of  preeclampsia  [name], MD OB Fellow 02/13/2018, 2:55 AM

## 2018-02-13 NOTE — MAU Note (Signed)
Pt states at midnight she felt a pop and then had a gush of clear fluid that has continued to trickle out. Pt reports some contractions every 5 mins. Pt denies vaginal bleeding. Reports fetal movement although none since water broke. Cervix has not been checked.

## 2018-02-13 NOTE — Lactation Note (Signed)
This note was copied from a baby's chart. Lactation Consultation Note  Patient Name: Girl [place] Today's Date: 02/13/2018 Reason for consult: Initial assessment;1st time breastfeeding;Primapara;Late-preterm 34-36.6wks   Initial assessment with first time mom of 42 hour old infant. Infant with 2 BF for 10 minutes, 3 BF attempts, formula x 1 of 13 cc, and 1 void since birth.   Mom had infant latched to the right breast in the football hold, infant was finishing her feeding, mom relatched infant independently when infant came off the breast. Mom reports she has no milk, she has started pumping and got a few gtts, enc her to feed to infant. Mom is offering formula post BF, she is aware of supplementation amounts to be used for infant. Enc mom to offer breast before offering formula to stimulate milk production. Mom reports she is able to express colostrum with hand expression. Enc mom to pump post BF about 8 x a day and follow with hand expression. Reviewed colostrum and milk coming to volume.    Mom has LPT infant policy, reviewed with family. . Mom is aware to feed infant 8-12 x in 24 hours at first feeding cues with no longer than 3 hours between feedings. Reviewed pump settings and cleaning. Reviewed with family what to expect with feeding a LPT infant.   Family is using a pacifier, discouraged its use and asked family to allow infant to meet suckling needs at the breast, they voiced understanding.   Bf Resources handout and LC Brochure given, mom informed of IP/OP Services, BF Support Groups and LC phone #. Mom is a [facility] client, she does not have a pump at home, mom agreeable to [facility] referral being sent for pump if needed at d/c.    Enc mom to call out for feeding assistance as needed. Mom reports she has no further questions/concerns at this time.    Maternal Data Formula Feeding for Exclusion: No Has patient been taught Hand Expression?: Yes Does the patient have breastfeeding experience  prior to this delivery?: No  Feeding Feeding Type: Breast Fed Length of feed: 10 min  LATCH Score Latch: Repeated attempts needed to sustain latch, nipple held in mouth throughout feeding, stimulation needed to elicit sucking reflex.  Audible Swallowing: A few with stimulation  Type of Nipple: Everted at rest and after stimulation  Comfort (Breast/Nipple): Soft / non-tender  Hold (Positioning): No assistance needed to correctly position infant at breast.  LATCH Score: 8  Interventions Interventions: Breast feeding basics reviewed;Support pillows;Skin to skin;Hand express;DEBP  Lactation Tools Discussed/Used WIC Program: Yes Pump Review: Setup, frequency, and cleaning Initiated by:: Reviewed pumping 8-12 x a day post Bf and follow wtih hand expression   Consult Status Consult Status: Follow-up Date: 02/14/18 Follow-up type: In-patient    Silas Flood Hice 02/13/2018, 4:56 PM

## 2018-02-14 MED ORDER — IBUPROFEN 600 MG PO TABS: 600.0000 mg | ORAL_TABLET | Freq: Four times a day (QID) | ORAL | 0 refills | Status: DC | PRN
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
## 2018-02-15 NOTE — Lactation Note (Addendum)
This note was copied from a baby's chart. Lactation Consultation Note  Infant is 16 hours old and is at 9% weight loss this am. Mother reports that she pumped her breast last night and got small amts of colostrum, Mother has been cue base feeding infant although she has a positional strip on the left nipple. Mother reports that she did give formula this am after a breastfeeding but she is unsure when and how much she took. Discussed supplemental guidelines with mother. Advised her to offer infant at least 30 ml or more if she will take. Lots of teaching with mother on safe guarding her milk supply. Assist mother with latching infant on in cross cradle hold. Infant has a shallow latch with pursed lips. Difficult to flange infants lips. Chin tug to pull lower jaw down and infant rolls lips back up. Advised mother to make sure infant has a wide gape.  Infant placed in football hold with good instructions on proper technique to latch. Mother brings infant to the breast well.  Infant bounces on and off the breast with only a few sucks and swallows. Mother taught breast compression. Mother is able to hand express large drops. Mother was given a harmony hand pump with instructions to use every 2-3 hours for 15 mins on each breast.  Mothers plan is to continue to breastfeed infant for 30 mins and then pace bottle feed. Mother advised to be consistent with pumping every 2-3 hours for 15 min. Mother is active with WIC . She reports that [facility] saw her yesterday and she told them she wanted to breast and formula feed. They informed her she could not get a pump. I am unsure of mothers commitment to obtain a good milk supply.  Patient Name: Melissa Kemp Today's Date: 02/15/2018 Reason for consult: Follow-up assessment   Maternal Data    Feeding Feeding Type: Formula Length of feed: (latched with a shallow latch on and off for a few mins)  LATCH Score Latch: Repeated attempts needed to sustain latch, nipple  held in mouth throughout feeding, stimulation needed to elicit sucking reflex.  Audible Swallowing: A few with stimulation  Type of Nipple: Everted at rest and after stimulation  Comfort (Breast/Nipple): Filling, red/small blisters or bruises, mild/mod discomfort  Hold (Positioning): Assistance needed to correctly position infant at breast and maintain latch.  LATCH Score: 6  Interventions    Lactation Tools Discussed/Used     Consult Status Consult Status: Follow-up Date: 02/16/18 Follow-up type: In-patient    Stevan Born [facility] 02/15/2018, 9:46 AM

## 2018-02-16 ENCOUNTER — Ambulatory Visit: Payer: Self-pay

## 2018-02-16 NOTE — Lactation Note (Signed)
This note was copied from a baby's chart. Lactation Consultation Note  Patient Name: Girl Kani Jobson Today's Date: 02/16/2018 Reason for consult: Follow-up assessment;Infant < 6lbs;Late-preterm 13-36.6wks  Visited with Mom on day of discharge, baby 59 hrs old.  Baby going to breast, Mom pumping both breasts regularly, milk volume in and baby exclusively breastmilk fed.   Encouraged STS, and feeding baby every 3 hrs or sooner if baby cues earlier.   WIC to provide Mom with a DEBP.  OP lactation referral sent.  Mom very committed to breastfeeding.    Mom knows to call prn for concerns.      Interventions Interventions: Breast feeding basics reviewed;Skin to skin;Breast massage;Hand express;DEBP  Lactation Tools Discussed/Used WIC Program: Yes   Consult Status Consult Status: Follow-up Date: 02/23/18 Follow-up type: Out-patient    Judee Clara 02/16/2018, 1:55 PM

## 2018-02-16 NOTE — Lactation Note (Signed)
This note was copied from a baby's chart. Lactation Consultation Note  Patient Name: Girl Pamelyn Bancroft Today's Date: 02/16/2018    [facility] Follow Up Visit:  Spoke with mother about breast/bottle feeding and she states baby is latching well and supplementing well.  She has no questions/concerns at this time.  Mother will call as needed for assistance.               Olliver Boyadjian R Kanyia Heaslip 02/16/2018, 5:23 AM

## 2018-02-17 ENCOUNTER — Ambulatory Visit: Payer: Self-pay

## 2018-02-17 NOTE — Lactation Note (Signed)
This note was copied from a baby's chart. Lactation Consultation Note: Mother reports wanting to rent a [facility] loaner pump. She was unaware that she needed cash. Mother advised to follow up with [facility] on Tuesday for [facility] pump. Mother has a hand pump and was informed that she would needed to pump every 2-3 hours for 15-20 mins on each breast. Made suggestions to mother about borrowing money. She reports that she cant borrow from anyone. Mother advised to do good breast massage and then pump. Advised mother to breastfeed infant and then supplement . Advised to offer infant as much as she will take of ebm. Infant just took 50 ml. Suggested to mother to try and get her to take at least up to 60 ml. Mother to follow up for repeat bilirubin in am at the hospital.  Mother is aware to feed infant with all feeding cues and at least 8-12 times in 24 hours. Mother declines assistance with latching infant. She reported to staff nurse that latch still hurts a little. Mother has nipple shield to use as needed. Mother reports that she may buy a pump. Mother reports that she has an appt with WIC. Mother is aware that she can phone [facility] office for concerns and she has an outpatient appt to follow up for a feeding assessment. Mother receptive to all teaching.   Patient Name: Girl Belarus Today's Date: 02/17/2018 Reason for consult: Follow-up assessment   Maternal Data    Feeding Feeding Type: Breast Milk Length of feed: 15 min  LATCH Score                   Interventions    Lactation Tools Discussed/Used     Consult Status Consult Status: Complete    Michel Bickers 02/17/2018, 2:46 PM

## 2018-02-22 ENCOUNTER — Ambulatory Visit: Payer: 59

## 2018-06-05 ENCOUNTER — Encounter (HOSPITAL_COMMUNITY): Payer: Self-pay | Admitting: Emergency Medicine

## 2018-06-05 ENCOUNTER — Emergency Department (HOSPITAL_COMMUNITY): Payer: 59

## 2018-06-05 ENCOUNTER — Other Ambulatory Visit: Payer: Self-pay

## 2018-06-05 ENCOUNTER — Emergency Department (HOSPITAL_COMMUNITY)
Admission: EM | Admit: 2018-06-05 | Discharge: 2018-06-05 | Disposition: A | Discharge status: Home / Self Care | Payer: 59 | Attending: Emergency Medicine | Admitting: Emergency Medicine

## 2018-06-05 DIAGNOSIS — S0083XA Contusion of other part of head, initial encounter: Secondary | ICD-10-CM

## 2018-06-05 DIAGNOSIS — R51 Headache: Secondary | ICD-10-CM | POA: Diagnosis present

## 2018-06-05 DIAGNOSIS — Z87891 Personal history of nicotine dependence: Secondary | ICD-10-CM | POA: Insufficient documentation

## 2018-06-05 DIAGNOSIS — R52 Pain, unspecified: Secondary | ICD-10-CM

## 2018-06-05 MED ORDER — IBUPROFEN 400 MG PO TABS
400.0000 mg | ORAL_TABLET | Freq: Once | ORAL | Status: AC
  Administered 2018-06-05: 400 mg via ORAL
  Filled 2018-06-05: qty 1

## 2018-06-05 MED ORDER — HYDROCODONE-ACETAMINOPHEN 5-325 MG PO TABS
1.0000 | ORAL_TABLET | Freq: Once | ORAL | Status: AC
  Administered 2018-06-05: 1 via ORAL
  Filled 2018-06-05: qty 1

## 2018-06-05 NOTE — Discharge Instructions (Signed)
Return to the ED with any concerns including not able to bite down or chew, difficulty swallowing, not able to open or close mouth, or any other alarming symptoms

## 2018-06-05 NOTE — ED Notes (Signed)
Patient declines ct, just "wants pain meds instead", md informed of same, tolerated po med, awake alert talkative in room with boyfriend
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
## 2018-06-05 NOTE — ED Notes (Signed)
Patient remains awake alert, color pink,chets clear,good areation,no retractions 3plus pulses<2sec refill, with boyfriend, awaiting ct
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
## 2018-06-05 NOTE — ED Notes (Signed)
Patient awake alert, co,or pink,chets clear,good aeration,no retractions 3 plus pulses,2sec refill,pt feeding infant currently,provider has seen

## 2018-06-05 NOTE — ED Notes (Signed)
Patient awake alert, provider at bedside 

## 2018-06-05 NOTE — ED Notes (Signed)
Patient returned from xray without incident

## 2018-06-05 NOTE — ED Notes (Signed)
Mother reports boyfriend tariuq is ok to come back
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
## 2019-10-22 ENCOUNTER — Encounter (HOSPITAL_COMMUNITY): Payer: Self-pay | Admitting: *Deleted

## 2019-10-22 ENCOUNTER — Emergency Department (HOSPITAL_COMMUNITY)
Admission: EM | Admit: 2019-10-22 | Discharge: 2019-10-22 | Disposition: A | Discharge status: Home / Self Care | Payer: 59 | Attending: Emergency Medicine | Admitting: Emergency Medicine

## 2019-10-22 ENCOUNTER — Other Ambulatory Visit: Payer: Self-pay

## 2019-10-22 DIAGNOSIS — Z79899 Other long term (current) drug therapy: Secondary | ICD-10-CM | POA: Diagnosis not present

## 2019-10-22 DIAGNOSIS — R519 Headache, unspecified: Secondary | ICD-10-CM | POA: Insufficient documentation

## 2019-10-22 DIAGNOSIS — R110 Nausea: Secondary | ICD-10-CM | POA: Diagnosis not present

## 2019-10-22 DIAGNOSIS — Z87891 Personal history of nicotine dependence: Secondary | ICD-10-CM | POA: Diagnosis not present

## 2019-10-22 LAB — CBG MONITORING, ED: Glucose-Capillary: 81 mg/dL (ref 70–99)

## 2019-10-22 MED ORDER — IBUPROFEN 800 MG PO TABS: 800.0000 mg | ORAL_TABLET | Freq: Three times a day (TID) | ORAL | 0 refills | Status: AC | PRN

## 2019-10-22 MED ORDER — ONDANSETRON 4 MG PO TBDP
4.0000 mg | ORAL_TABLET | Freq: Once | ORAL | Status: AC
  Administered 2019-10-22: 4 mg via ORAL
  Filled 2019-10-22: qty 1

## 2019-10-22 MED ORDER — KETOROLAC TROMETHAMINE 60 MG/2ML IM SOLN
60.0000 mg | Freq: Once | INTRAMUSCULAR | Status: AC
  Administered 2019-10-22: 60 mg via INTRAMUSCULAR
  Filled 2019-10-22: qty 2

## 2019-10-22 NOTE — ED Triage Notes (Signed)
Pt states she often has headaches, this morning on left side with some nausea, Tylenol taken PTA without relief

## 2019-10-22 NOTE — Discharge Instructions (Addendum)
Follow-up with your family doctor if any problem 

## 2019-10-22 NOTE — ED Provider Notes (Signed)
[facility]-EMERGENCY DEPT Provider Note   CSN: [id number] Arrival date & time: 10/22/19  1027     History Chief Complaint  Patient presents with  . Headache  . Nausea    Melissa Kemp is a 24 y.o. female.  Patient complains of a headache.  Some nausea.  Patient has a history of headaches  The history is provided by the patient. No language interpreter was used.  Headache Pain location:  Frontal Quality:  Dull Radiates to:  Does not radiate Severity currently:  5/10 Severity at highest:  7/10 Onset quality:  Sudden Timing:  Constant Progression:  Worsening Chronicity:  New Similar to prior headaches: no   Associated symptoms: no abdominal pain, no back pain, no congestion, no cough, no diarrhea, no fatigue, no seizures and no sinus pressure        History reviewed. No pertinent past medical history.  Patient Active Problem List   Diagnosis Date Noted  . Preterm premature rupture of membranes (PPROM) with unknown onset of labor 02/13/2018  . Preterm delivery, delivered 02/13/2018  . Preeclampsia 02/13/2018    Past Surgical History:  Procedure Laterality Date  . DILATION AND EVACUATION       OB History    Gravida  2   Para  1   Term      Preterm  1   AB  1   Living  1     SAB      TAB  1   Ectopic      Multiple  0   Live Births  1           Family History  Problem Relation Age of Onset  . Hypertension Mother   . Hypertension Father     Social History   Tobacco Use  . Smoking status: Former Smoker    Quit date: 02/28/2016    Years since quitting: 3.6  . Smokeless tobacco: Never Used  Substance Use Topics  . Alcohol use: No  . Drug use: No    Home Medications Prior to Admission medications   Medication Sig Start Date End Date Taking? Authorizing Provider  acetaminophen (TYLENOL) 500 MG tablet Take 1 tablet (500 mg total) every 6 (six) hours as needed by mouth. 08/05/17  Yes Everlene Farrier, PA-C   etonogestrel (NEXPLANON) 68 MG IMPL implant 1 each by Subdermal route once.   Yes [provider]  ibuprofen (ADVIL) 800 MG tablet Take 1 tablet (800 mg total) by mouth every 8 (eight) hours as needed for headache. 10/22/19   Bethann Berkshire, MD    Allergies    Patient has no known allergies.  Review of Systems   Review of Systems  Constitutional: Negative for appetite change and fatigue.  HENT: Negative for congestion, ear discharge and sinus pressure.   Eyes: Negative for discharge.  Respiratory: Negative for cough.   Cardiovascular: Negative for chest pain.  Gastrointestinal: Negative for abdominal pain and diarrhea.  Genitourinary: Negative for frequency and hematuria.  Musculoskeletal: Negative for back pain.  Skin: Negative for rash.  Neurological: Positive for headaches. Negative for seizures.  Psychiatric/Behavioral: Negative for hallucinations.    Physical Exam Updated Vital Signs BP 119/61 (BP Location: Right Arm)   Pulse 78   Temp 98.4 F (36.9 C) (Oral)   Resp 18   Ht 5\' 3"  (1.6 m)   Wt 51.3 kg   SpO2 100%   BMI 20.02 kg/m   Physical Exam Constitutional:  Appearance: Normal appearance. She is well-developed.  HENT:     Head: Normocephalic.     Mouth/Throat:     Mouth: Mucous membranes are moist.  Eyes:     General: No scleral icterus.    Conjunctiva/sclera: Conjunctivae normal.  Neck:     Thyroid: No thyromegaly.  Cardiovascular:     Rate and Rhythm: Normal rate and regular rhythm.     Heart sounds: No murmur. No friction rub. No gallop.   Pulmonary:     Breath sounds: No stridor. No wheezing or rales.  Chest:     Chest wall: No tenderness.  Abdominal:     General: There is no distension.     Tenderness: There is no abdominal tenderness. There is no rebound.  Musculoskeletal:        General: Normal range of motion.     Cervical back: Neck supple.  Lymphadenopathy:     Cervical: No cervical adenopathy.  Skin:    Findings: No  erythema or rash.  Neurological:     Mental Status: She is alert and oriented to person, place, and time.     Motor: No abnormal muscle tone.     Coordination: Coordination normal.  Psychiatric:        Behavior: Behavior normal.     ED Results / Procedures / Treatments   Labs (all labs ordered are listed, but only abnormal results are displayed) Labs Reviewed  CBG MONITORING, ED    EKG None  Radiology No results found.  Procedures Procedures (including critical care time)  Medications Ordered in ED Medications  ketorolac (TORADOL) injection 60 mg (60 mg Intramuscular Given 10/22/19 1157)  ondansetron (ZOFRAN-ODT) disintegrating tablet 4 mg (4 mg Oral Given 10/22/19 1156)    ED Course  I have reviewed the triage vital signs and the nursing notes.  Pertinent labs & imaging results that were available during my care of the patient were reviewed by me and considered in my medical decision making (see chart for details).    MDM Rules/Calculators/A&P                     Patient with a headache that improved with Toradol and Zofran.  She will follow-up as needed  Final Clinical Impression(s) / ED Diagnoses Final diagnoses:  Bad headache    Rx / DC Orders ED Discharge Orders         Ordered    ibuprofen (ADVIL) 800 MG tablet  Every 8 hours PRN     10/22/19 1420           Milton Ferguson, MD 10/22/19 1423
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
